# Patient Record
Sex: Female | Born: 1970 | Race: Black or African American | Hispanic: No | Marital: Single | Smoking: Never smoker
Health system: Southern US, Community
[De-identification: ages and names within clinical notes are randomized; demographics above are authoritative.]

## PROBLEM LIST (undated history)

## (undated) DIAGNOSIS — K3184 Gastroparesis: Secondary | ICD-10-CM

## (undated) DIAGNOSIS — I10 Essential (primary) hypertension: Secondary | ICD-10-CM

## (undated) DIAGNOSIS — E119 Type 2 diabetes mellitus without complications: Secondary | ICD-10-CM

## (undated) DIAGNOSIS — F32A Depression, unspecified: Secondary | ICD-10-CM

## (undated) DIAGNOSIS — G629 Polyneuropathy, unspecified: Secondary | ICD-10-CM

## (undated) DIAGNOSIS — F329 Major depressive disorder, single episode, unspecified: Secondary | ICD-10-CM

## (undated) DIAGNOSIS — F419 Anxiety disorder, unspecified: Secondary | ICD-10-CM

## (undated) HISTORY — PX: TUBAL LIGATION: SHX77

## (undated) HISTORY — PX: ABDOMINAL HYSTERECTOMY: SHX81

## (undated) HISTORY — PX: CHOLECYSTECTOMY: SHX55

---

## 1998-08-26 ENCOUNTER — Encounter: Admission: RE | Admit: 1998-08-26 | Discharge: 1998-11-24 | Payer: Self-pay

## 2006-01-24 ENCOUNTER — Ambulatory Visit: Payer: Self-pay | Admitting: Cardiovascular Disease

## 2006-02-03 ENCOUNTER — Encounter: Payer: Self-pay | Admitting: Cardiology

## 2006-02-03 ENCOUNTER — Ambulatory Visit: Payer: Self-pay

## 2007-12-21 ENCOUNTER — Encounter: Payer: Self-pay | Admitting: Internal Medicine

## 2008-01-07 DIAGNOSIS — I10 Essential (primary) hypertension: Secondary | ICD-10-CM

## 2008-01-07 DIAGNOSIS — F329 Major depressive disorder, single episode, unspecified: Secondary | ICD-10-CM

## 2008-01-07 DIAGNOSIS — K219 Gastro-esophageal reflux disease without esophagitis: Secondary | ICD-10-CM

## 2008-01-08 ENCOUNTER — Ambulatory Visit: Payer: Self-pay | Admitting: Internal Medicine

## 2008-01-08 DIAGNOSIS — R05 Cough: Secondary | ICD-10-CM

## 2008-04-14 ENCOUNTER — Ambulatory Visit: Payer: Self-pay | Admitting: Internal Medicine

## 2008-04-15 ENCOUNTER — Ambulatory Visit: Payer: Self-pay | Admitting: Internal Medicine

## 2008-04-23 ENCOUNTER — Ambulatory Visit (HOSPITAL_COMMUNITY): Admission: RE | Admit: 2008-04-23 | Discharge: 2008-04-23 | Payer: Self-pay | Admitting: Internal Medicine

## 2008-04-23 ENCOUNTER — Encounter: Payer: Self-pay | Admitting: Internal Medicine

## 2008-05-15 ENCOUNTER — Ambulatory Visit: Payer: Self-pay | Admitting: Internal Medicine

## 2008-05-27 ENCOUNTER — Emergency Department (HOSPITAL_COMMUNITY): Admission: EM | Admit: 2008-05-27 | Discharge: 2008-05-27 | Payer: Self-pay | Admitting: Emergency Medicine

## 2010-10-22 NOTE — Assessment & Plan Note (Signed)
Deanna Stokes                              CARDIOLOGY OFFICE NOTE   Deanna Stokes, Deanna Stokes                       MRN:          [id number]  DATE:01/24/2006                            DOB:          1970/10/15    IDENTIFYING INFORMATION:  The patient is a 40 year old married woman with 2  children who lives in [place], [place].   CHIEF COMPLAINT:  Chest pain.   HISTORY OF PRESENT ILLNESS:  Deanna Stokes is a very pleasant 40 year old  African-American woman who presents with a longstanding history of  substernal chest pain.  She reports a chest pain syndrome since her early  40s.  It has become slowly progressive over the years and is more frequent  now than it ever has been.  She describes her pain as a heaviness and  pressure.  It is mainly in the mid chest and sometimes radiates to the  back.  The episodes last anywhere from 1-3 hours and occur frequently.  They  are unrelated to food intake, position or activity.  She is unable to  relieve her symptoms when the episodes do occur.  She reports associated  dyspnea, as well as dizziness.  She denies syncope, nausea, vomiting or  diaphoresis.  She has taken 1 sublingual nitroglycerin for her chest  discomfort, which did partially relieve her pain.   She has a longstanding history of severe gastroesophageal reflux disease and  has had multiple esophageal dilatation procedures.  She is able to  distinguish her current chest pain from her reflux pain and says they are  dissimilar.   PAST MEDICAL HISTORY:  Pertinent for the following:  1. Hypertension, treated with Micardis for many years.  2. Gastroesophageal reflux, status post multiple esophageal procedures as      above.  3. Hysterectomy, 2007.  4. Cholecystectomy, 2004.  5. Anxiety and depression.  6. Arthritis.   SOCIAL HISTORY:  The patient is married with 2 children.  She lives in [place]  and she works as a Audiological scientist at [employer].  She  does not smoke cigarettes  or drink alcohol.  She does a lot of walking at work but is not engaged in a  formal exercise program.   FAMILY HISTORY:  Mostly unknown as she was adopted.  She has been told that  her mother died at age 3 of a gunshot and that her father died of a stroke  at an unknown age.  She does know her biological siblings.  She has 1  brother and 2 sisters.  One sister has heart disease but the specifics are  unclear.   CURRENT MEDICATIONS:  1. Micardis 80 mg daily.  2. Nexium 40 mg daily.  3. Xanax 0.5 mg three times daily.  4. Nitroglycerin as needed but has only taken this once.  5. Percocet as needed.  6. Flexeril as needed.   ALLERGIES:  MOTRIN, IBUPROFEN and VICODIN.   REVIEW OF SYSTEMS:  Complete 12-point review of systems was performed.  Pertinent positives included ankle edema, rapid heart rate, headaches,  lightheadedness, shortness of breath, dizziness,  calf pain and back pain.  All other systems were reviewed and are negative except as described above.   PHYSICAL EXAMINATION:  GENERAL:  She is an alert and oriented African-  American woman in no acute distress.  HEIGHT:  5'7  WEIGHT:  206 pounds.  VITAL SIGNS:  Blood pressure 124/92, heart rate 92, respiratory rate 12.  EYES:  Sclerae anicteric.  Conjunctivae pink.  Pupils are equal, round and  reactive to light.  ENT:  Oropharynx is clear.  Moist oral mucosa.  No cervical lymphadenopathy.  CARDIOVASCULAR:  PMI is discrete and nondisplaced.  Heart has regular rate  and rhythm without murmurs or gallops.  Jugular venous pressure is normal.  Carotid upstrokes are normal without bruits.  ABDOMEN:  Soft, nontender.  No organomegaly.  There is a midline abdominal  scar present.  There are no abdominal bruits.  EXTREMITIES:  No clubbing, cyanosis or edema.  Peripheral pulses are 2+ and  equal throughout in the periphery.  SKIN:  Warm and dry without rash.   ELECTROCARDIOGRAM:  EKG shows normal sinus  rhythm and is within normal  limits.   ASSESSMENT/PLAN:  This is a 40 year old woman presenting with longstanding  chest pain that has slowly accelerated over the years.  Her problem list is  as follows:  1. Chest pain.  2. Hypertension.  3. Gastroesophageal reflux disease.   DISCUSSION:  I think it is unlikely that her pain is related to ischemic  cardiac disease due to the lack of association with exercise or exertion.  However, she does have longstanding symptoms.  Her family history is unknown  and she has risk factor of hypertension.  I have suggested and exercise  echocardiogram to evaluate her functional capacity, as well as for  myocardial ischemia.  If this is negative, I would continue therapy with  Micardis for her  hypertension and I do not think any further workup would be necessary.  We  will followup with her by telephone after the results are available.                                 Micheline Chapman, MD    MDC/MedQ  DD:  01/24/2006  DT:  01/25/2006  Job #:  [id number]   cc:   Samuel Jester

## 2012-10-11 ENCOUNTER — Ambulatory Visit: Payer: Self-pay | Admitting: Cardiology

## 2012-11-20 ENCOUNTER — Encounter: Payer: Self-pay | Admitting: Cardiology

## 2014-10-19 ENCOUNTER — Emergency Department (HOSPITAL_COMMUNITY): Payer: Medicare HMO

## 2014-10-19 ENCOUNTER — Emergency Department (HOSPITAL_COMMUNITY)
Admission: EM | Admit: 2014-10-19 | Discharge: 2014-10-19 | Disposition: A | Discharge status: Home / Self Care | Payer: Medicare HMO | Attending: Emergency Medicine | Admitting: Emergency Medicine

## 2014-10-19 ENCOUNTER — Encounter (HOSPITAL_COMMUNITY): Payer: Self-pay | Admitting: Emergency Medicine

## 2014-10-19 ENCOUNTER — Other Ambulatory Visit (HOSPITAL_COMMUNITY): Payer: Self-pay

## 2014-10-19 DIAGNOSIS — R000 Tachycardia, unspecified: Secondary | ICD-10-CM | POA: Diagnosis not present

## 2014-10-19 DIAGNOSIS — R079 Chest pain, unspecified: Secondary | ICD-10-CM | POA: Insufficient documentation

## 2014-10-19 DIAGNOSIS — R011 Cardiac murmur, unspecified: Secondary | ICD-10-CM | POA: Insufficient documentation

## 2014-10-19 DIAGNOSIS — R61 Generalized hyperhidrosis: Secondary | ICD-10-CM | POA: Diagnosis not present

## 2014-10-19 DIAGNOSIS — I10 Essential (primary) hypertension: Secondary | ICD-10-CM | POA: Insufficient documentation

## 2014-10-19 DIAGNOSIS — Z8659 Personal history of other mental and behavioral disorders: Secondary | ICD-10-CM | POA: Diagnosis not present

## 2014-10-19 DIAGNOSIS — R002 Palpitations: Secondary | ICD-10-CM | POA: Diagnosis not present

## 2014-10-19 DIAGNOSIS — Z8669 Personal history of other diseases of the nervous system and sense organs: Secondary | ICD-10-CM | POA: Insufficient documentation

## 2014-10-19 DIAGNOSIS — R42 Dizziness and giddiness: Secondary | ICD-10-CM | POA: Insufficient documentation

## 2014-10-19 DIAGNOSIS — R0602 Shortness of breath: Secondary | ICD-10-CM | POA: Insufficient documentation

## 2014-10-19 DIAGNOSIS — E119 Type 2 diabetes mellitus without complications: Secondary | ICD-10-CM | POA: Diagnosis not present

## 2014-10-19 HISTORY — DX: Depression, unspecified: F32.A

## 2014-10-19 HISTORY — DX: Major depressive disorder, single episode, unspecified: F32.9

## 2014-10-19 HISTORY — DX: Essential (primary) hypertension: I10

## 2014-10-19 HISTORY — DX: Polyneuropathy, unspecified: G62.9

## 2014-10-19 HISTORY — DX: Anxiety disorder, unspecified: F41.9

## 2014-10-19 HISTORY — DX: Type 2 diabetes mellitus without complications: E11.9

## 2014-10-19 LAB — I-STAT TROPONIN, ED
TROPONIN I, POC: 0 ng/mL (ref 0.00–0.08)
Troponin i, poc: 0 ng/mL (ref 0.00–0.08)

## 2014-10-19 LAB — BASIC METABOLIC PANEL
Anion gap: 13 (ref 5–15)
CALCIUM: 9.3 mg/dL (ref 8.9–10.3)
CHLORIDE: 100 mmol/L — AB (ref 101–111)
CO2: 20 mmol/L — AB (ref 22–32)
CREATININE: 0.61 mg/dL (ref 0.44–1.00)
GFR calc non Af Amer: >60 mL/min (ref >60)
Glucose, Bld: 239 mg/dL — ABNORMAL HIGH (ref 65–99)
Potassium: 2.9 mmol/L — ABNORMAL LOW (ref 3.5–5.1)
Sodium: 133 mmol/L — ABNORMAL LOW (ref 135–145)

## 2014-10-19 LAB — CBC
HEMATOCRIT: 35 % — AB (ref 36.0–46.0)
HEMOGLOBIN: 12.6 g/dL (ref 12.0–15.0)
MCH: 29.6 pg (ref 26.0–34.0)
MCHC: 36 g/dL (ref 30.0–36.0)
MCV: 82.4 fL (ref 78.0–100.0)
PLATELETS: 395 10*3/uL (ref 150–400)
RBC: 4.25 MIL/uL (ref 3.87–5.11)
RDW: 11.9 % (ref 11.5–15.5)
WBC: 8.3 10*3/uL (ref 4.0–10.5)

## 2014-10-19 LAB — D-DIMER, QUANTITATIVE (NOT AT ARMC)

## 2014-10-19 LAB — PROTIME-INR
INR: 1.11 (ref 0.00–1.49)
PROTHROMBIN TIME: 14.5 s (ref 11.6–15.2)

## 2014-10-19 MED ORDER — POTASSIUM CHLORIDE CRYS ER 20 MEQ PO TBCR
40.0000 meq | EXTENDED_RELEASE_TABLET | Freq: Once | ORAL | Status: AC
  Administered 2014-10-19: 40 meq via ORAL
  Filled 2014-10-19: qty 2

## 2014-10-19 NOTE — ED Provider Notes (Signed)
CSN: [id number]     Arrival date & time 10/19/14  1505 History   None    Chief Complaint  Patient presents with  . Chest Pain  . Shortness of Breath  . Tachycardia   Deanna KindsJosie G Stokes is a 44 y.o. female with a past medical history significant for diabetes, hypertension and esophageal injury from a remote lye ingestion who presents with chest pain. The patient reports that for the last 6 months, she has had exertional chest pain. The patient describes the pain is located in her central and left chest. The patient denies radiation but says that she does have associated palpitations, tachycardia, diaphoresis, lightheadedness, and shortness of breath. The patient reports that she has not been evaluated in the emergency Department for these complaints however, she did see a PCP last week who referred her to see cardiology next week. The patient was told to stay on bed rest given the exertional component of her symptoms and was given a prescription for nitroglycerin. The patient reports that she took her nitroglycerin 2 days ago which improved her chest pain when she felt.  The patient reports this morning, she went against her bed rest and went to see her father speak at church and while he was having a sermon, she felt sudden onset of her chest pain. The patient came diaphoretic, presyncopal, and short of breath. The EMS team was called and the patient was given aspirin as well as 2 doses of nitroglycerin which completely resolved her symptoms. The patient is chest pain-free on arrival. The patient denies any fevers, chills, nausea, vomiting, dysphagia, diarrhea, dysuria or any rashes. Patient denies any other complaints at this time.   (Consider location/radiation/quality/duration/timing/severity/associated sxs/prior Treatment) Patient is a 44 y.o. female presenting with chest pain and shortness of breath. The history is provided by the patient and the EMS personnel. No language interpreter was used.   Chest Pain Pain location:  Substernal area and L chest Pain quality: crushing and pressure   Pain radiates to:  Does not radiate Pain radiates to the back: no   Pain severity:  Severe Onset quality:  Sudden Duration:  2 hours Timing:  Constant Progression:  Waxing and waning Chronicity:  Recurrent Context: at rest   Context: no trauma   Relieved by:  Rest and nitroglycerin Worsened by:  Exertion Ineffective treatments:  None tried Associated symptoms: diaphoresis, dizziness, palpitations and shortness of breath   Associated symptoms: no abdominal pain, no back pain, no cough, no fatigue, no fever, no headache, no nausea, no near-syncope, no numbness, no syncope, not vomiting and no weakness   Risk factors: diabetes mellitus and hypertension   Risk factors: not female, no prior DVT/PE and no smoking   Shortness of Breath Severity:  Moderate Onset quality:  Gradual Timing:  Intermittent Progression:  Waxing and waning Chronicity:  Recurrent Relieved by:  Nothing Worsened by:  Nothing tried Ineffective treatments:  None tried Associated symptoms: chest pain and diaphoresis   Associated symptoms: no abdominal pain, no cough, no fever, no headaches, no neck pain, no rash, no syncope, no vomiting and no wheezing     Past Medical History  Diagnosis Date  . Hypertension   . Diabetes mellitus without complication   . Neuropathy   . Anxiety   . Depression    Past Surgical History  Procedure Laterality Date  . Cholecystectomy    . Tubal ligation    . Abdominal hysterectomy     No family history on file.  History  Substance Use Topics  . Smoking status: Never Smoker   . Smokeless tobacco: Not on file  . Alcohol Use: Yes   OB History    No data available     Review of Systems  Constitutional: Positive for diaphoresis. Negative for fever, chills, appetite change and fatigue.  HENT: Negative for congestion and rhinorrhea.   Respiratory: Positive for shortness of breath.  Negative for cough, chest tightness, wheezing and stridor.   Cardiovascular: Positive for chest pain and palpitations. Negative for leg swelling, syncope and near-syncope.  Gastrointestinal: Negative for nausea, vomiting, abdominal pain, diarrhea and constipation.  Genitourinary: Negative for dysuria.  Musculoskeletal: Negative for back pain, neck pain and neck stiffness.  Skin: Negative for rash and wound.  Neurological: Positive for dizziness and light-headedness. Negative for syncope, weakness, numbness and headaches.  Psychiatric/Behavioral: Negative for confusion.  All other systems reviewed and are negative.     Allergies  Hydrocodone-acetaminophen and Ibuprofen  Home Medications   Prior to Admission medications   Not on File   BP 117/86 mmHg  Temp(Src) 98.8 F (37.1 C) (Oral)  Resp 22  Ht  (1.702 m)  Wt 196 lb (88.905 kg)  BMI 30.69 kg/m2  SpO2 100% Physical Exam  Constitutional: She is oriented to person, place, and time. She appears well-developed and well-nourished. No distress.  HENT:  Head: Normocephalic and atraumatic.  Mouth/Throat: No oropharyngeal exudate.  Eyes: Conjunctivae and EOM are normal. Pupils are equal, round, and reactive to light.  Neck: Normal range of motion.  Cardiovascular: Regular rhythm and intact distal pulses.   Murmur heard.  Systolic murmur is present with a grade of 2/6  Pulmonary/Chest: Effort normal and breath sounds normal. No accessory muscle usage or stridor. No respiratory distress. She has no wheezes. She has no rales. She exhibits no tenderness and no bony tenderness.  Abdominal: Soft. Bowel sounds are normal. She exhibits no distension. There is no tenderness. There is no rebound and no guarding.  Musculoskeletal: She exhibits no edema.  Neurological: She is alert and oriented to person, place, and time. She displays normal reflexes. She exhibits normal muscle tone.  Skin: Skin is warm. She is not diaphoretic. No pallor.   Psychiatric: She has a normal mood and affect.  Nursing note and vitals reviewed.   ED Course  Procedures (including critical care time) Labs Review Labs Reviewed  CBC - Abnormal; Notable for the following:    HCT 35.0 (*)    All other components within normal limits  BASIC METABOLIC PANEL - Abnormal; Notable for the following:    Sodium 133 (*)    Potassium 2.9 (*)    Chloride 100 (*)    CO2 20 (*)    Glucose, Bld 239 (*)    BUN <5 (*)    All other components within normal limits  PROTIME-INR  D-DIMER, QUANTITATIVE  I-STAT TROPOININ, ED  Rosezena Sensor, ED    Imaging Review Dg Chest 2 View  10/19/2014   CLINICAL DATA:  Chest pain.  Shortness of breath.  Tachycardia.  EXAM: CHEST  2 VIEW  COMPARISON:  08/27/2014 and 11/14/2011  FINDINGS: The heart size and mediastinal contours are within normal limits. Both lungs are clear. The visualized skeletal structures are unremarkable.  IMPRESSION: Normal exam.   Electronically Signed   By: Francene Boyers M.D.   On: 10/19/2014 16:32     EKG Interpretation None        MDM   Final diagnoses:  Chest  pain, unspecified chest pain type   Deanna KindsJosie G Stokes is a 44 y.o. female with a past medical history significant for diabetes, hypertension and esophageal injury from a remote lye ingestion who presents with chest pain. The patient's initial story was concerning for ACS or other cardiac etiology of her symptoms. Also in the differential diagnosis list is pulmonary embolism or esophageal pains given her repaired esophagus from the remote injuries.  The patient's EKG on arrival did not show evidence of an ST elevation MI however, there did appear to be some diffuse ST abnormalities. The patient remained chest pain-free while in the emergency department. The patient's delta troponin was negative 2. The patient had a unremarkable CBC, and her be nothing by mouth was significant for hypokalemia of 2.9. The patient was given by mouth potassium  for replacement. The patient's d-dimer was negative. The patient's chest x-ray showed no acute cardiopulmonary abnormalities.  As the patient's chest pain has resolved and did not return during her several hour observation time in the emergency department, the patient was felt appropriate for discharge. The patient is already scheduled to see her cardiologist in 2 days' time for further management of her chest pain symptoms. PE felt unlikely given the negative d-dimer, ACS felt unlikely given the delta troponin negative 2. Likely source is esophageal, anxiety, or musculoskeletal in nature.  The patient was given extremely strict return precautions for any new or return of her chest pain symptoms. The patient voiced understanding of the return precautions as did her family that was at the bedside. The patient will see her cardiologist in 2 days and will likely need further workup at that time.  The patient had no questions, concerns, or complaints and was discharged in good condition.  This patient was seen with Dr. Rhunette CroftNanavati, ED attending.        Theda Belfasthris Tegeler, MD 10/20/14 [id number]  Derwood KaplanAnkit Nanavati, MD 10/28/14 [id number]

## 2014-10-19 NOTE — ED Notes (Signed)
Received pt via EMS from church with c/o chest pain with shortness of breath since 0500 today. Pt was suppose to be on bedrest but went to church. Pt reports that her heart has been racing throughout the day about 4-5 times. Pt took 1 nitro earlier for pain which relieved pain some what. Pt given 2 Nitro by EMS and 324 mg of ASA. Pain 1/10. CBG 559 for EMS, someone at church gave pt a shot of Novolog but pt does not know how many units.

## 2014-10-19 NOTE — Discharge Instructions (Signed)

## 2014-10-19 NOTE — ED Notes (Signed)
Patient transported to X-ray 

## 2014-10-19 NOTE — ED Notes (Signed)
Pt requesting pain medication.  

## 2016-09-07 LAB — HEMOGLOBIN A1C: Hemoglobin A1C: 11

## 2016-09-14 ENCOUNTER — Ambulatory Visit: Payer: Medicare HMO | Attending: Neurology | Admitting: Neurology

## 2016-09-14 DIAGNOSIS — G4733 Obstructive sleep apnea (adult) (pediatric): Secondary | ICD-10-CM

## 2016-09-17 NOTE — Procedures (Signed)
[facility] Jakelin Taussig A. Gerilyn Pilgrim, MD     [email or web address]             NOCTURNAL POLYSOMNOGRAPHY   LOCATION: [facility]   Patient Name: Anael, Rosch Date: 09/14/2016 Gender: Female D.O.B: 1971/03/15 Age (years): 53 Referring Provider: Jorge Mandril NP Height (inches): 67 Interpreting Physician: Beryle Beams MD, ABSM Weight (lbs): 210 RPSGT: Peak, Robert BMI: 33 MRN: [id number] Neck Size: 15.50 CLINICAL INFORMATION Sleep Study Type: NPSG  Indication for sleep study: N/A  Epworth Sleepiness Score: 11  SLEEP STUDY TECHNIQUE As per the AASM Manual for the Scoring of Sleep and Associated Events v2.3 (April 2016) with a hypopnea requiring 4% desaturations.  The channels recorded and monitored were frontal, central and occipital EEG, electrooculogram (EOG), submentalis EMG (chin), nasal and oral airflow, thoracic and abdominal wall motion, anterior tibialis EMG, snore microphone, electrocardiogram, and pulse oximetry.  MEDICATIONS Medications self-administered by patient taken the night of the study : N/A  Current Outpatient Prescriptions:  .  ALPRAZolam (XANAX) 0.5 MG tablet, Take 0.5 mg by mouth 3 (three) times daily. , Disp: , Rfl:  .  amLODipine (NORVASC) 5 MG tablet, Take 5 mg by mouth daily. , Disp: , Rfl:  .  aspirin EC 81 MG tablet, Take 81 mg by mouth daily., Disp: , Rfl:  .  beclomethasone (QVAR) 40 MCG/ACT inhaler, Inhale 2 puffs into the lungs 3 (three) times daily as needed (shortness of breath/wheezing). , Disp: , Rfl:  .  canagliflozin (INVOKANA) 300 MG TABS tablet, Take 300 mg by mouth daily., Disp: , Rfl:  .  carisoprodol (SOMA) 350 MG tablet, Take 350 mg by mouth 4 (four) times daily. , Disp: , Rfl:  .  cloNIDine (CATAPRES) 0.1 MG tablet, Take 0.2 mg by mouth 2 (two) times daily. , Disp: , Rfl:  .  docusate sodium (COLACE) 100 MG capsule, Take 100 mg by mouth daily as needed for mild constipation., Disp: , Rfl:  .  DULoxetine (CYMBALTA)  60 MG capsule, Take 60 mg by mouth 2 (two) times daily. , Disp: , Rfl:  .  esomeprazole (NEXIUM) 40 MG capsule, Take 40 mg by mouth 2 (two) times daily before a meal. , Disp: , Rfl:  .  estradiol (CLIMARA - DOSED IN MG/24 HR) 0.075 mg/24hr patch, Place 0.075 mg onto the skin every 3 (three) days. , Disp: , Rfl:  .  furosemide (LASIX) 20 MG tablet, Take 20 mg by mouth daily. , Disp: , Rfl:  .  gabapentin (NEURONTIN) 600 MG tablet, Take 600 mg by mouth 3 (three) times daily. , Disp: , Rfl:  .  insulin glargine (LANTUS) 100 UNIT/ML injection, Inject 50 Units into the skin at bedtime., Disp: , Rfl:  .  insulin lispro (HUMALOG) 100 UNIT/ML injection, Inject into the skin 3 (three) times daily before meals. Per sliding scale CBG 0-150 0 units, 151-200 2 units, 201-250 4 units, 251-300 6 units, 301-350 8 units, 351-400 10 units, 401-450 12 units, Disp: , Rfl:  .  lisinopril (PRINIVIL,ZESTRIL) 40 MG tablet, Take 40 mg by mouth daily. , Disp: , Rfl:  .  metoCLOPramide (REGLAN) 10 MG tablet, Take 10 mg by mouth 4 (four) times daily as needed for nausea or vomiting. , Disp: , Rfl:  .  morphine (MSIR) 30 MG tablet, Take 30 mg by mouth every 4 (four) hours. scheduled, Disp: , Rfl:  .  nitroGLYCERIN (NITROSTAT) 0.4 MG SL tablet, Place 0.4 mg under the tongue every 5 (five) minutes as needed  for chest pain., Disp: , Rfl:  .  omega-3 acid ethyl esters (LOVAZA) 1 G capsule, Take 1 g by mouth 2 (two) times daily. OMACOR, Disp: , Rfl:  .  ondansetron (ZOFRAN) 4 MG tablet, Take 4 mg by mouth every 4 (four) hours. Take with morphine, Disp: , Rfl:  .  Prenatal Vit-Fe Fumarate-FA (PRENATAL MULTIVITAMIN) TABS tablet, Take 1 tablet by mouth daily., Disp: , Rfl:  .  temazepam (RESTORIL) 30 MG capsule, Take 30 mg by mouth at bedtime. , Disp: , Rfl:  .  Vitamin D, Ergocalciferol, (DRISDOL) 50000 UNITS CAPS capsule, Take 50,000 Units by mouth 2 (two) times a week. Sunday and Thursday, Disp: , Rfl:    SLEEP ARCHITECTURE The  study was initiated at 9:49:46 PM and ended at 4:39:59 AM.  Sleep onset time was 37.8 minutes and the sleep efficiency was 65.6%. The total sleep time was 268.9 minutes.  Stage REM latency was N/A minutes.  The patient spent 16.33% of the night in stage N1 sleep, 83.67% in stage N2 sleep, 0.00% in stage N3 and 0.00% in REM.  Alpha intrusion was absent.  Supine sleep was 80.21%.  RESPIRATORY PARAMETERS The overall apnea/hypopnea index (AHI) was 5.6 per hour. There were 3 total apneas, including 0 obstructive, 3 central and 0 mixed apneas. There were 22 hypopneas and 47 RERAs.  The AHI during Stage REM sleep was N/A per hour.  AHI while supine was 6.4 per hour.  The mean oxygen saturation was 93.08%. The minimum SpO2 during sleep was 88.00%.  Soft snoring was noted during this study.  CARDIAC DATA The 2 lead EKG demonstrated sinus rhythm. The mean heart rate was 96.75 beats per minute. Other EKG findings include: None. LEG MOVEMENT DATA The total PLMS were 0 with a resulting PLMS index of 0.00. Associated arousal with leg movement index was 0.0.  IMPRESSIONS - Mild obstructive sleep apnea that does not require positive pressure treatment. - Absent slow wave sleep and absent REM sleep are noted.  Argie Ramming, MD Diplomate, American Board of Sleep Medicine.

## 2016-10-14 ENCOUNTER — Encounter: Payer: Self-pay | Admitting: "Endocrinology

## 2016-10-14 ENCOUNTER — Ambulatory Visit (INDEPENDENT_AMBULATORY_CARE_PROVIDER_SITE_OTHER): Payer: Medicare HMO | Admitting: "Endocrinology

## 2016-10-14 VITALS — BP 146/91 | HR 102 | Ht 66.25 in | Wt 223.0 lb

## 2016-10-14 DIAGNOSIS — E7800 Pure hypercholesterolemia, unspecified: Secondary | ICD-10-CM | POA: Diagnosis not present

## 2016-10-14 DIAGNOSIS — E1169 Type 2 diabetes mellitus with other specified complication: Secondary | ICD-10-CM | POA: Diagnosis not present

## 2016-10-14 DIAGNOSIS — Z6839 Body mass index (BMI) 39.0-39.9, adult: Secondary | ICD-10-CM

## 2016-10-14 DIAGNOSIS — E1165 Type 2 diabetes mellitus with hyperglycemia: Secondary | ICD-10-CM | POA: Diagnosis not present

## 2016-10-14 DIAGNOSIS — Z794 Long term (current) use of insulin: Secondary | ICD-10-CM | POA: Diagnosis not present

## 2016-10-14 DIAGNOSIS — IMO0002 Reserved for concepts with insufficient information to code with codable children: Secondary | ICD-10-CM | POA: Insufficient documentation

## 2016-10-14 DIAGNOSIS — I10 Essential (primary) hypertension: Secondary | ICD-10-CM | POA: Diagnosis not present

## 2016-10-14 NOTE — Progress Notes (Signed)
Subjective:    Patient ID: Deanna Stokes, female    DOB: July 28, 1970. Patient is being seen in Consultation for management of diabetes requested by  Ernestine Conrad, MD  Past Medical History:  Diagnosis Date  . Anxiety   . Depression   . Diabetes mellitus without complication (HCC)   . Hypertension   . Neuropathy    Past Surgical History:  Procedure Laterality Date  . ABDOMINAL HYSTERECTOMY    . CHOLECYSTECTOMY    . TUBAL LIGATION     Social History   Social History  . Marital status: Single    Spouse name: N/A  . Number of children: N/A  . Years of education: N/A   Social History Main Topics  . Smoking status: Never Smoker  . Smokeless tobacco: Never Used  . Alcohol use Yes  . Drug use: No  . Sexual activity: Not Asked   Other Topics Concern  . None   Social History Narrative  . None   Outpatient Encounter Prescriptions as of 10/14/2016  Medication Sig  . amLODipine (NORVASC) 5 MG tablet Take 5 mg by mouth daily.   Marland Kitchen aspirin EC 81 MG tablet Take 81 mg by mouth daily.  Marland Kitchen atorvastatin (LIPITOR) 20 MG tablet Take 20 mg by mouth daily.  . cloNIDine (CATAPRES) 0.1 MG tablet Take 0.1 mg by mouth 2 (two) times daily.  . DULoxetine (CYMBALTA) 60 MG capsule Take 60 mg by mouth daily.  Marland Kitchen esomeprazole (NEXIUM) 40 MG capsule Take 40 mg by mouth daily at 12 noon.  Marland Kitchen estradiol (CLIMARA - DOSED IN MG/24 HR) 0.075 mg/24hr patch Place 0.075 mg onto the skin once a week.  . furosemide (LASIX) 20 MG tablet Take 20 mg by mouth.  . gabapentin (NEURONTIN) 600 MG tablet Take 600 mg by mouth 3 (three) times daily.  . hydrOXYzine (ATARAX/VISTARIL) 25 MG tablet Take 25 mg by mouth 3 (three) times daily as needed.  . insulin aspart protamine - aspart (NOVOLOG MIX 70/30 FLEXPEN) (70-30) 100 UNIT/ML FlexPen Inject 20-26 Units into the skin 3 (three) times daily with meals.  . Insulin Glargine (LANTUS SOLOSTAR) 100 UNIT/ML Solostar Pen Inject 60 Units into the skin at bedtime.  Marland Kitchen lisinopril  (PRINIVIL,ZESTRIL) 40 MG tablet Take 40 mg by mouth daily.  . metoCLOPramide (REGLAN) 10 MG tablet Take 10 mg by mouth 4 (four) times daily as needed for nausea or vomiting.   Marland Kitchen omega-3 acid ethyl esters (LOVAZA) 1 G capsule Take 1 g by mouth 2 (two) times daily. OMACOR  . ondansetron (ZOFRAN) 4 MG tablet Take 4 mg by mouth every 8 (eight) hours as needed for nausea or vomiting.  . temazepam (RESTORIL) 30 MG capsule Take 30 mg by mouth at bedtime as needed for sleep.  Marland Kitchen tiZANidine (ZANAFLEX) 4 MG tablet Take 8 mg by mouth every 8 (eight) hours as needed for muscle spasms.  . Vitamin D, Ergocalciferol, (DRISDOL) 50000 UNITS CAPS capsule Take 50,000 Units by mouth 2 (two) times a week. Sunday and Thursday  . [DISCONTINUED] ALPRAZolam (XANAX) 0.5 MG tablet Take 0.5 mg by mouth 3 (three) times daily.   . [DISCONTINUED] aspirin EC 81 MG tablet Take 81 mg by mouth daily.  . [DISCONTINUED] beclomethasone (QVAR) 40 MCG/ACT inhaler Inhale 2 puffs into the lungs 3 (three) times daily as needed (shortness of breath/wheezing).   . [DISCONTINUED] canagliflozin (INVOKANA) 300 MG TABS tablet Take 300 mg by mouth daily.  . [DISCONTINUED] carisoprodol (SOMA) 350 MG tablet Take  350 mg by mouth 4 (four) times daily.   . [DISCONTINUED] cloNIDine (CATAPRES) 0.1 MG tablet Take 0.2 mg by mouth 2 (two) times daily.   . [DISCONTINUED] docusate sodium (COLACE) 100 MG capsule Take 100 mg by mouth daily as needed for mild constipation.  . [DISCONTINUED] DULoxetine (CYMBALTA) 60 MG capsule Take 60 mg by mouth 2 (two) times daily.   . [DISCONTINUED] esomeprazole (NEXIUM) 40 MG capsule Take 40 mg by mouth 2 (two) times daily before a meal.   . [DISCONTINUED] estradiol (CLIMARA - DOSED IN MG/24 HR) 0.075 mg/24hr patch Place 0.075 mg onto the skin every 3 (three) days.   . [DISCONTINUED] furosemide (LASIX) 20 MG tablet Take 20 mg by mouth daily.   . [DISCONTINUED] gabapentin (NEURONTIN) 600 MG tablet Take 600 mg by mouth 3 (three)  times daily.   . [DISCONTINUED] insulin glargine (LANTUS) 100 UNIT/ML injection Inject 50 Units into the skin at bedtime.  . [DISCONTINUED] insulin lispro (HUMALOG) 100 UNIT/ML injection Inject into the skin 3 (three) times daily before meals. Per sliding scale CBG 0-150 0 units, 151-200 2 units, 201-250 4 units, 251-300 6 units, 301-350 8 units, 351-400 10 units, 401-450 12 units  . [DISCONTINUED] lisinopril (PRINIVIL,ZESTRIL) 40 MG tablet Take 40 mg by mouth daily.   . [DISCONTINUED] morphine (MSIR) 30 MG tablet Take 30 mg by mouth every 4 (four) hours. scheduled  . [DISCONTINUED] nitroGLYCERIN (NITROSTAT) 0.4 MG SL tablet Place 0.4 mg under the tongue every 5 (five) minutes as needed for chest pain.  . [DISCONTINUED] ondansetron (ZOFRAN) 4 MG tablet Take 4 mg by mouth every 4 (four) hours. Take with morphine  . [DISCONTINUED] Prenatal Vit-Fe Fumarate-FA (PRENATAL MULTIVITAMIN) TABS tablet Take 1 tablet by mouth daily.  . [DISCONTINUED] temazepam (RESTORIL) 30 MG capsule Take 30 mg by mouth at bedtime.    No facility-administered encounter medications on file as of 10/14/2016.    ALLERGIES: Allergies  Allergen Reactions  . Ibuprofen Shortness Of Breath and Rash  . Ciprofloxacin Hives  . Metformin And Related Other (See Comments)    MD told patient not to take  . Hydrocodone-Acetaminophen Rash   VACCINATION STATUS:  There is no immunization history on file for this patient.  Diabetes  She presents for her initial diabetic visit. She has type 1 diabetes mellitus. Onset time: She was diagnosed at approximate age of 46 years. Her disease course has been worsening. There are no hypoglycemic associated symptoms. Pertinent negatives for hypoglycemia include no confusion, headaches, pallor or seizures. Associated symptoms include blurred vision, fatigue, foot paresthesias, polydipsia and polyuria. Pertinent negatives for diabetes include no chest pain and no polyphagia. There are no hypoglycemic  complications. Symptoms are worsening. Diabetic complications include peripheral neuropathy and retinopathy. Risk factors for coronary artery disease include diabetes mellitus, dyslipidemia, family history, hypertension, obesity and sedentary lifestyle. Current diabetic treatment includes intensive insulin program. She is following a generally unhealthy diet. When asked about meal planning, she reported none. She has not had a previous visit with a dietitian. She never participates in exercise. Her home blood glucose trend is fluctuating dramatically. Her overall blood glucose range is >200 mg/dl. (She brought her log showing significantly frustrating blood glucose profile, her most recent A1c was 11% on 09/07/2016.) An ACE inhibitor/angiotensin II receptor blocker is being taken. Eye exam is current.  Hyperlipidemia  This is a chronic problem. The current episode started more than 1 year ago. Exacerbating diseases include diabetes and obesity. Pertinent negatives include no chest pain, myalgias  or shortness of breath. Risk factors for coronary artery disease include diabetes mellitus, dyslipidemia, hypertension, obesity and a sedentary lifestyle.  Hypertension  This is a chronic problem. The current episode started more than 1 year ago. Associated symptoms include blurred vision. Pertinent negatives include no chest pain, headaches, palpitations or shortness of breath. Risk factors for coronary artery disease include dyslipidemia, diabetes mellitus, obesity and sedentary lifestyle. Past treatments include ACE inhibitors. Hypertensive end-organ damage includes retinopathy.       Review of Systems  Constitutional: Positive for fatigue. Negative for chills, fever and unexpected weight change.  HENT: Negative for trouble swallowing and voice change.   Eyes: Positive for blurred vision. Negative for visual disturbance.  Respiratory: Negative for cough, shortness of breath and wheezing.   Cardiovascular:  Negative for chest pain, palpitations and leg swelling.  Gastrointestinal: Negative for diarrhea, nausea and vomiting.  Endocrine: Positive for polydipsia and polyuria. Negative for cold intolerance, heat intolerance and polyphagia.  Musculoskeletal: Negative for arthralgias and myalgias.  Skin: Negative for color change, pallor, rash and wound.  Neurological: Negative for seizures and headaches.  Psychiatric/Behavioral: Negative for confusion and suicidal ideas.    Objective:    BP (!) 146/91   Pulse (!) 102   Ht 5' 6.25" (1.683 m)   Wt 223 lb (101.2 kg)   BMI 35.72 kg/m   Wt Readings from Last 3 Encounters:  10/14/16 223 lb (101.2 kg)  09/15/16 210 lb (95.3 kg)  10/19/14 196 lb (88.9 kg)    Physical Exam  Constitutional: She is oriented to person, place, and time. She appears well-developed.  Walks with a cane due to peripheral neuropathy.  HENT:  Head: Normocephalic and atraumatic.  Eyes: EOM are normal.  Neck: Normal range of motion. Neck supple. No tracheal deviation present. No thyromegaly present.  Cardiovascular: Normal rate and regular rhythm.   Pulmonary/Chest: Effort normal and breath sounds normal.  Abdominal: Soft. Bowel sounds are normal. There is no tenderness. There is no guarding.  Musculoskeletal: Normal range of motion. She exhibits no edema.  Neurological: She is alert and oriented to person, place, and time. She has normal reflexes. No cranial nerve deficit. Coordination normal.  Skin: Skin is warm and dry. No rash noted. No erythema. No pallor.  Psychiatric: She has a normal mood and affect. Judgment normal.     CMP ( most recent) CMP     Component Value Date/Time   NA 133 (L) 10/19/2014 1542   K 2.9 (L) 10/19/2014 1542   CL 100 (L) 10/19/2014 1542   CO2 20 (L) 10/19/2014 1542   GLUCOSE 239 (H) 10/19/2014 1542   BUN <5 (L) 10/19/2014 1542   CREATININE 0.61 10/19/2014 1542   CALCIUM 9.3 10/19/2014 1542   GFRNONAA >60 10/19/2014 1542   GFRAA >60  10/19/2014 1542   A1c from 04/04/ 2018 was 11%   Assessment & Plan:   1. Uncontrolled type 2 diabetes mellitus with other specified complication, with long-term current use of insulin (HCC)  - Patient has currently uncontrolled symptomatic type 1 DM since  46 years of age,  with most recent A1c of 11 %. Recent labs reviewed.   Her diabetes is complicated by peripheral neuropathy, bilateral retinopathy, obesity/sedentary life and patient remains at a high risk for more acute and chronic complications of diabetes which include CAD, CVA, CKD, retinopathy, and neuropathy. These are all discussed in detail with the patient.  - I have counseled the patient on diet management and weight loss, by  adopting a carbohydrate restricted/protein rich diet.  - Suggestion is made for patient to avoid simple carbohydrates   from their diet including Cakes , Desserts, Ice Cream,  Soda (  diet and regular) , Sweet Tea , Candies,  Chips, Cookies, Artificial Sweeteners,   and "Sugar-free" Products . This will help patient to have stable blood glucose profile and potentially avoid unintended weight gain.  - I encouraged the patient to switch to  unprocessed or minimally processed complex starch and increased protein intake (animal or plant source), fruits, and vegetables.  - Patient is advised to stick to a routine mealtimes to eat 3 meals  a day and avoid unnecessary snacks ( to snack only to correct hypoglycemia).  - The patient will be scheduled with Norm Salt, RDN, CDE for individualized DM education.  - I have approached patient with the following individualized plan to manage diabetes and patient agrees:   - She reports multiple episodes of diabetic ketoacidosis in the past, at least once in the last year. - Given her current significant glycemic burden, she will continue to need intensive treatment with basal/bolus insulin. -  I  will proceed to adjust her basal insulin Levemir to 60 units daily at  bedtime, lower her prandial insulin NovoLog to 20 units 3 times a day before meals for pre-meal BG readings of 90-150mg /dl, plus patient specific correction dose for unexpected hyperglycemia above 150mg /dl, associated with strict monitoring of glucose  AC and HS. - Patient is warned not to take insulin without proper monitoring per orders. -Adjustment parameters are given for hypo and hyperglycemia in writing. -Patient is encouraged to call clinic for blood glucose levels less than 70 or above 300 mg /dl. - She may need studies to classify her diabetes properly when she approaches target A1c of 7-8%.  - If she has type 2 diabetes rather than type I, she may benefit from either none insulin therapeutic options. - Patient specific target  A1c;  LDL, HDL, Triglycerides, and  Waist Circumference were discussed in detail.  2) BP/HTN: Uncontrolled. Continue current medications including ACEI/ARB. 3) Lipids/HPL:   Control unknown.   Patient is advised tocontinue statins. 4)  Weight/Diet: CDE Consult will be initiated , exercise, and detailed carbohydrates information provided.  5) Chronic Care/Health Maintenance:  -Patient is on ACEI/ARB and Statin medications and encouraged to continue to follow up with Ophthalmology, Podiatrist at least yearly or according to recommendations, and advised to   stay away from smoking. I have recommended yearly flu vaccine and pneumonia vaccination at least every 5 years; moderate intensity exercise for up to 150 minutes weekly; and  sleep for at least 7 hours a day.  - 60 minutes of time was spent on the care of this patient , 50% of which was applied for counseling on diabetes complications and their preventions.  - Patient to bring meter and  blood glucose logs during her next visit.   - I advised patient to maintain close follow up with Ernestine Conrad, MD for primary care needs.  Follow up plan: - Return in about 1 week (around 10/21/2016) for follow up with meter  and logs- no labs.  Marquis Lunch, MD Phone: [phone]  Fax: [phone]   10/14/2016, 12:23 PM

## 2016-10-14 NOTE — Patient Instructions (Signed)

## 2016-10-26 ENCOUNTER — Ambulatory Visit (INDEPENDENT_AMBULATORY_CARE_PROVIDER_SITE_OTHER): Payer: Medicare HMO | Admitting: "Endocrinology

## 2016-10-26 ENCOUNTER — Encounter: Payer: Self-pay | Admitting: "Endocrinology

## 2016-10-26 ENCOUNTER — Other Ambulatory Visit: Payer: Self-pay

## 2016-10-26 VITALS — BP 116/77 | HR 89 | Ht 66.25 in | Wt 229.0 lb

## 2016-10-26 DIAGNOSIS — Z6839 Body mass index (BMI) 39.0-39.9, adult: Secondary | ICD-10-CM

## 2016-10-26 DIAGNOSIS — IMO0002 Reserved for concepts with insufficient information to code with codable children: Secondary | ICD-10-CM

## 2016-10-26 DIAGNOSIS — E1065 Type 1 diabetes mellitus with hyperglycemia: Secondary | ICD-10-CM

## 2016-10-26 DIAGNOSIS — I10 Essential (primary) hypertension: Secondary | ICD-10-CM

## 2016-10-26 DIAGNOSIS — E7800 Pure hypercholesterolemia, unspecified: Secondary | ICD-10-CM | POA: Diagnosis not present

## 2016-10-26 DIAGNOSIS — E1042 Type 1 diabetes mellitus with diabetic polyneuropathy: Secondary | ICD-10-CM

## 2016-10-26 MED ORDER — GLUCAGON (RDNA) 1 MG IJ KIT: 1.0000 mg | PACK | Freq: Once | INTRAMUSCULAR | 12 refills | Status: AC | PRN

## 2016-10-26 MED ORDER — GLUCAGON HCL (RDNA) 1 MG IJ SOLR: 1.0000 mg | Freq: Once | INTRAMUSCULAR | 1 refills | Status: DC | PRN

## 2016-10-26 NOTE — Patient Instructions (Signed)

## 2016-10-26 NOTE — Progress Notes (Signed)
Subjective:    Patient ID: Deanna Stokes, female    DOB: 10-30-1970. Patient is being seen in f/u  for management of diabetes .  Past Medical History:  Diagnosis Date  . Anxiety   . Depression   . Diabetes mellitus without complication (HCC)   . Hypertension   . Neuropathy    Past Surgical History:  Procedure Laterality Date  . ABDOMINAL HYSTERECTOMY    . CHOLECYSTECTOMY    . TUBAL LIGATION     Social History   Social History  . Marital status: Single    Spouse name: N/A  . Number of children: N/A  . Years of education: N/A   Social History Main Topics  . Smoking status: Never Smoker  . Smokeless tobacco: Never Used  . Alcohol use Yes  . Drug use: No  . Sexual activity: Not Asked   Other Topics Concern  . None   Social History Narrative  . None   Outpatient Encounter Prescriptions as of 10/26/2016  Medication Sig  . amLODipine (NORVASC) 5 MG tablet Take 5 mg by mouth daily.   Marland Kitchen aspirin EC 81 MG tablet Take 81 mg by mouth daily.  Marland Kitchen atorvastatin (LIPITOR) 20 MG tablet Take 20 mg by mouth daily.  . cloNIDine (CATAPRES) 0.1 MG tablet Take 0.1 mg by mouth 2 (two) times daily.  . DULoxetine (CYMBALTA) 60 MG capsule Take 60 mg by mouth daily.  Marland Kitchen esomeprazole (NEXIUM) 40 MG capsule Take 40 mg by mouth daily at 12 noon.  Marland Kitchen estradiol (CLIMARA - DOSED IN MG/24 HR) 0.075 mg/24hr patch Place 0.075 mg onto the skin once a week.  . furosemide (LASIX) 20 MG tablet Take 20 mg by mouth.  . gabapentin (NEURONTIN) 600 MG tablet Take 600 mg by mouth 3 (three) times daily.  Marland Kitchen glucagon (GLUCAGON EMERGENCY) 1 MG injection Inject 1 mg into the vein once as needed.  . hydrOXYzine (ATARAX/VISTARIL) 25 MG tablet Take 25 mg by mouth 3 (three) times daily as needed.  . insulin aspart protamine - aspart (NOVOLOG MIX 70/30 FLEXPEN) (70-30) 100 UNIT/ML FlexPen Inject 15-21 Units into the skin 3 (three) times daily with meals.  . Insulin Glargine (LANTUS SOLOSTAR) 100 UNIT/ML Solostar Pen  Inject 40 Units into the skin at bedtime.  Marland Kitchen lisinopril (PRINIVIL,ZESTRIL) 40 MG tablet Take 40 mg by mouth daily.  . metoCLOPramide (REGLAN) 10 MG tablet Take 10 mg by mouth 4 (four) times daily as needed for nausea or vomiting.   Marland Kitchen omega-3 acid ethyl esters (LOVAZA) 1 G capsule Take 1 g by mouth 2 (two) times daily. OMACOR  . ondansetron (ZOFRAN) 4 MG tablet Take 4 mg by mouth every 8 (eight) hours as needed for nausea or vomiting.  . temazepam (RESTORIL) 30 MG capsule Take 30 mg by mouth at bedtime as needed for sleep.  Marland Kitchen tiZANidine (ZANAFLEX) 4 MG tablet Take 8 mg by mouth every 8 (eight) hours as needed for muscle spasms.  . Vitamin D, Ergocalciferol, (DRISDOL) 50000 UNITS CAPS capsule Take 50,000 Units by mouth 2 (two) times a week. Sunday and Thursday   No facility-administered encounter medications on file as of 10/26/2016.    ALLERGIES: Allergies  Allergen Reactions  . Ibuprofen Shortness Of Breath and Rash  . Ciprofloxacin Hives  . Metformin And Related Other (See Comments)    MD told patient not to take  . Hydrocodone-Acetaminophen Rash   VACCINATION STATUS:  There is no immunization history on file for this patient.  Diabetes  She presents for her follow-up diabetic visit. She has type 2 diabetes mellitus. Onset time: She was diagnosed at approximate age of 31 years. Her disease course has been worsening. There are no hypoglycemic associated symptoms. Pertinent negatives for hypoglycemia include no confusion, headaches, pallor or seizures. Associated symptoms include blurred vision, fatigue, foot paresthesias, polydipsia and polyuria. Pertinent negatives for diabetes include no chest pain and no polyphagia. There are no hypoglycemic complications. Symptoms are worsening. Diabetic complications include peripheral neuropathy and retinopathy. Risk factors for coronary artery disease include diabetes mellitus, dyslipidemia, family history, hypertension, obesity and sedentary  lifestyle. Current diabetic treatment includes intensive insulin program. Her weight is increasing steadily. She is following a generally unhealthy diet. When asked about meal planning, she reported none. She has not had a previous visit with a dietitian. She never participates in exercise. Her home blood glucose trend is fluctuating dramatically. Her breakfast blood glucose range is generally 90-110 mg/dl. Her lunch blood glucose range is generally 180-200 mg/dl. Her dinner blood glucose range is generally 180-200 mg/dl. Her overall blood glucose range is 180-200 mg/dl. (She brought her log showing significantly frustrating blood glucose profile, her most recent A1c was 11% on 09/07/2016.) An ACE inhibitor/angiotensin II receptor blocker is being taken. Eye exam is current.  Hyperlipidemia  This is a chronic problem. The current episode started more than 1 year ago. Exacerbating diseases include diabetes and obesity. Pertinent negatives include no chest pain, myalgias or shortness of breath. Risk factors for coronary artery disease include diabetes mellitus, dyslipidemia, hypertension, obesity and a sedentary lifestyle.  Hypertension  This is a chronic problem. The current episode started more than 1 year ago. Associated symptoms include blurred vision. Pertinent negatives include no chest pain, headaches, palpitations or shortness of breath. Risk factors for coronary artery disease include dyslipidemia, diabetes mellitus, obesity and sedentary lifestyle. Past treatments include ACE inhibitors. Hypertensive end-organ damage includes retinopathy.     Review of Systems  Constitutional: Positive for fatigue. Negative for chills, fever and unexpected weight change.  HENT: Negative for trouble swallowing and voice change.   Eyes: Positive for blurred vision. Negative for visual disturbance.  Respiratory: Negative for cough, shortness of breath and wheezing.   Cardiovascular: Negative for chest pain,  palpitations and leg swelling.  Gastrointestinal: Negative for diarrhea, nausea and vomiting.  Endocrine: Positive for polydipsia and polyuria. Negative for cold intolerance, heat intolerance and polyphagia.  Musculoskeletal: Negative for arthralgias and myalgias.  Skin: Negative for color change, pallor, rash and wound.  Neurological: Negative for seizures and headaches.  Psychiatric/Behavioral: Negative for confusion and suicidal ideas.    Objective:    BP 116/77   Pulse 89   Ht 5' 6.25" (1.683 m)   Wt 229 lb (103.9 kg)   BMI 36.68 kg/m   Wt Readings from Last 3 Encounters:  10/26/16 229 lb (103.9 kg)  10/14/16 223 lb (101.2 kg)  09/15/16 210 lb (95.3 kg)    Physical Exam  Constitutional: She is oriented to person, place, and time. She appears well-developed.  Walks with a cane due to peripheral neuropathy.  HENT:  Head: Normocephalic and atraumatic.  Eyes: EOM are normal.  Neck: Normal range of motion. Neck supple. No tracheal deviation present. No thyromegaly present.  Cardiovascular: Normal rate and regular rhythm.   Pulmonary/Chest: Effort normal and breath sounds normal.  Abdominal: Soft. Bowel sounds are normal. There is no tenderness. There is no guarding.  Musculoskeletal: Normal range of motion. She exhibits no edema.  Neurological:  She is alert and oriented to person, place, and time. She has normal reflexes. No cranial nerve deficit. Coordination normal.  Skin: Skin is warm and dry. No rash noted. No erythema. No pallor.  Psychiatric: She has a normal mood and affect. Judgment normal.     CMP ( most recent) CMP     Component Value Date/Time   NA 133 (L) 10/19/2014 1542   K 2.9 (L) 10/19/2014 1542   CL 100 (L) 10/19/2014 1542   CO2 20 (L) 10/19/2014 1542   GLUCOSE 239 (H) 10/19/2014 1542   BUN <5 (L) 10/19/2014 1542   CREATININE 0.61 10/19/2014 1542   CALCIUM 9.3 10/19/2014 1542   GFRNONAA >60 10/19/2014 1542   GFRAA >60 10/19/2014 1542   A1c from  04/04/ 2018 was 11%   Assessment & Plan:   1. Uncontrolled type 1 diabetes mellitus with other specified complication, with long-term current use of insulin (HCC)  - Patient has currently uncontrolled symptomatic type 1 DM since  46 years of age,  with most recent A1c of 11 %. Recent labs reviewed.   Her diabetes is complicated by peripheral neuropathy, bilateral retinopathy, obesity/sedentary life and patient remains at a high risk for more acute and chronic complications of diabetes which include CAD, CVA, CKD, retinopathy, and neuropathy. These are all discussed in detail with the patient.  - I have counseled the patient on diet management and weight loss, by adopting a carbohydrate restricted/protein rich diet.  - Suggestion is made for patient to avoid simple carbohydrates   from their diet including Cakes , Desserts, Ice Cream,  Soda (  diet and regular) , Sweet Tea , Candies,  Chips, Cookies, Artificial Sweeteners,   and "Sugar-free" Products . This will help patient to have stable blood glucose profile and potentially avoid unintended weight gain.  - I encouraged the patient to switch to  unprocessed or minimally processed complex starch and increased protein intake (animal or plant source), fruits, and vegetables.  - Patient is advised to stick to a routine mealtimes to eat 3 meals  a day and avoid unnecessary snacks ( to snack only to correct hypoglycemia).  - The patient will be scheduled with Norm SaltPenny Crumpton, RDN, CDE for individualized DM education.  - I have approached patient with the following individualized plan to manage diabetes and patient agrees:   - She reports multiple episodes of diabetic ketoacidosis in the past, at least once in the last year. - Given her current significant glycemic burden, she will continue to need intensive treatment with basal/bolus insulin. -  I  will proceed to Lower her Levemir to 40 units daily at bedtime, lower her prandial insulin NovoLog to  15 units 3 times a day before meals for pre-meal BG readings of 90-150mg /dl, plus patient specific correction dose for unexpected hyperglycemia above 150mg /dl, associated with strict monitoring of glucose  AC and HS. - Patient is warned not to take insulin without proper monitoring per orders. -Adjustment parameters are given for hypo and hyperglycemia in writing. -Patient is encouraged to call clinic for blood glucose levels less than 70 or above 300 mg /dl. - She will need additional studies to classify her diabetes properly.  - If she has type 2 diabetes rather than type I, she may benefit from either none insulin therapeutic options. - Patient specific target  A1c;  LDL, HDL, Triglycerides, and  Waist Circumference were discussed in detail.  2) BP/HTN: controlled. Continue current medications including ACEI/ARB. 3) Lipids/HPL:  Control unknown.   Patient is advised tocontinue statins. 4)  Weight/Diet: CDE Consult has been initiated , exercise, and detailed carbohydrates information provided.  5) Chronic Care/Health Maintenance:  -Patient is on ACEI/ARB and Statin medications and encouraged to continue to follow up with Ophthalmology, Podiatrist at least yearly or according to recommendations, and advised to   stay away from smoking. I have recommended yearly flu vaccine and pneumonia vaccination at least every 5 years; moderate intensity exercise for up to 150 minutes weekly; and  sleep for at least 7 hours a day.  - 30 minutes of time was spent on the care of this patient , 50% of which was applied for counseling on diabetes complications and their preventions.  - Patient to bring meter and  blood glucose logs during her next visit.   - I advised patient to maintain close follow up with Dr. Margo Common for primary care needs.  Follow up plan: - Return in about 9 weeks (around 12/28/2016) for follow up with pre-visit labs, meter, and logs.  Marquis Lunch, MD Phone: [phone]  Fax:  [phone]   10/26/2016, 12:02 PM

## 2016-11-14 ENCOUNTER — Ambulatory Visit: Payer: Self-pay | Admitting: Nutrition

## 2016-12-19 ENCOUNTER — Ambulatory Visit: Payer: Self-pay | Admitting: Nutrition

## 2016-12-28 ENCOUNTER — Ambulatory Visit: Payer: Medicare HMO | Admitting: "Endocrinology

## 2017-01-12 ENCOUNTER — Ambulatory Visit: Payer: Medicare HMO | Admitting: "Endocrinology

## 2017-02-13 ENCOUNTER — Ambulatory Visit: Payer: Medicare HMO | Admitting: "Endocrinology

## 2017-02-16 LAB — LIPID PANEL
CHOLESTEROL: 169 (ref 0–200)
HDL: 38 (ref 35–70)
LDL Cholesterol: 94
TRIGLYCERIDES: 188 — AB (ref 40–160)

## 2017-02-16 LAB — BASIC METABOLIC PANEL
BUN: 5 (ref 4–21)
Creatinine: 0.6 (ref <=1.1)

## 2017-02-16 LAB — TSH: TSH: 4.85 (ref <=5.90)

## 2017-02-16 LAB — HEMOGLOBIN A1C: HEMOGLOBIN A1C: 11.9

## 2017-02-27 ENCOUNTER — Encounter: Payer: Self-pay | Admitting: "Endocrinology

## 2017-02-27 ENCOUNTER — Ambulatory Visit (INDEPENDENT_AMBULATORY_CARE_PROVIDER_SITE_OTHER): Payer: Medicare HMO | Admitting: "Endocrinology

## 2017-02-27 VITALS — BP 140/95 | HR 105 | Ht 66.25 in | Wt 227.0 lb

## 2017-02-27 DIAGNOSIS — I10 Essential (primary) hypertension: Secondary | ICD-10-CM | POA: Diagnosis not present

## 2017-02-27 DIAGNOSIS — E782 Mixed hyperlipidemia: Secondary | ICD-10-CM | POA: Insufficient documentation

## 2017-02-27 DIAGNOSIS — E1065 Type 1 diabetes mellitus with hyperglycemia: Secondary | ICD-10-CM | POA: Diagnosis not present

## 2017-02-27 DIAGNOSIS — IMO0002 Reserved for concepts with insufficient information to code with codable children: Secondary | ICD-10-CM | POA: Insufficient documentation

## 2017-02-27 DIAGNOSIS — E1042 Type 1 diabetes mellitus with diabetic polyneuropathy: Secondary | ICD-10-CM | POA: Diagnosis not present

## 2017-02-27 DIAGNOSIS — Z6839 Body mass index (BMI) 39.0-39.9, adult: Secondary | ICD-10-CM

## 2017-02-27 NOTE — Patient Instructions (Signed)

## 2017-02-27 NOTE — Progress Notes (Signed)
Subjective:    Patient ID: Deanna Stokes, female    DOB: August 12, 1970. Patient is being seen in f/u  for management of diabetes .  Past Medical History:  Diagnosis Date  . Anxiety   . Depression   . Diabetes mellitus without complication (HCC)   . Hypertension   . Neuropathy    Past Surgical History:  Procedure Laterality Date  . ABDOMINAL HYSTERECTOMY    . CHOLECYSTECTOMY    . TUBAL LIGATION     Social History   Social History  . Marital status: Single    Spouse name: N/A  . Number of children: N/A  . Years of education: N/A   Social History Main Topics  . Smoking status: Never Smoker  . Smokeless tobacco: Never Used  . Alcohol use Yes  . Drug use: No  . Sexual activity: Not Asked   Other Topics Concern  . None   Social History Narrative  . None   Outpatient Encounter Prescriptions as of 02/27/2017  Medication Sig  . insulin aspart (NOVOLOG FLEXPEN) 100 UNIT/ML FlexPen Inject 15-21 Units into the skin 3 (three) times daily with meals.  Marland Kitchen amLODipine (NORVASC) 5 MG tablet Take 5 mg by mouth daily.   Marland Kitchen aspirin EC 81 MG tablet Take 81 mg by mouth daily.  Marland Kitchen atorvastatin (LIPITOR) 20 MG tablet Take 20 mg by mouth daily.  . cloNIDine (CATAPRES) 0.1 MG tablet Take 0.1 mg by mouth 2 (two) times daily.  . DULoxetine (CYMBALTA) 60 MG capsule Take 60 mg by mouth daily.  Marland Kitchen esomeprazole (NEXIUM) 40 MG capsule Take 40 mg by mouth daily at 12 noon.  Marland Kitchen estradiol (CLIMARA - DOSED IN MG/24 HR) 0.075 mg/24hr patch Place 0.075 mg onto the skin once a week.  . furosemide (LASIX) 20 MG tablet Take 20 mg by mouth.  . gabapentin (NEURONTIN) 600 MG tablet Take 600 mg by mouth 3 (three) times daily.  Marland Kitchen glucagon (GLUCAGON EMERGENCY) 1 MG injection Inject 1 mg into the vein once as needed.  . hydrOXYzine (ATARAX/VISTARIL) 25 MG tablet Take 25 mg by mouth 3 (three) times daily as needed.  . Insulin Glargine (LANTUS SOLOSTAR) 100 UNIT/ML Solostar Pen Inject 40 Units into the skin at  bedtime.  Marland Kitchen lisinopril (PRINIVIL,ZESTRIL) 40 MG tablet Take 40 mg by mouth daily.  . metoCLOPramide (REGLAN) 10 MG tablet Take 10 mg by mouth 4 (four) times daily as needed for nausea or vomiting.   Marland Kitchen omega-3 acid ethyl esters (LOVAZA) 1 G capsule Take 1 g by mouth 2 (two) times daily. OMACOR  . ondansetron (ZOFRAN) 4 MG tablet Take 4 mg by mouth every 8 (eight) hours as needed for nausea or vomiting.  . temazepam (RESTORIL) 30 MG capsule Take 30 mg by mouth at bedtime as needed for sleep.  Marland Kitchen tiZANidine (ZANAFLEX) 4 MG tablet Take 8 mg by mouth every 8 (eight) hours as needed for muscle spasms.  . Vitamin D, Ergocalciferol, (DRISDOL) 50000 UNITS CAPS capsule Take 50,000 Units by mouth 2 (two) times a week. Sunday and Thursday  . [DISCONTINUED] glucagon (GLUCAGEN HYPOKIT) 1 MG SOLR injection Inject 1 mg into the vein once as needed for low blood sugar.  . [DISCONTINUED] insulin aspart protamine - aspart (NOVOLOG MIX 70/30 FLEXPEN) (70-30) 100 UNIT/ML FlexPen Inject 15-21 Units into the skin 3 (three) times daily with meals.   No facility-administered encounter medications on file as of 02/27/2017.    ALLERGIES: Allergies  Allergen Reactions  . Ibuprofen  Shortness Of Breath and Rash  . Ciprofloxacin Hives  . Metformin And Related Other (See Comments)    MD told patient not to take  . Hydrocodone-Acetaminophen Rash   VACCINATION STATUS:  There is no immunization history on file for this patient.  Diabetes  She presents for her follow-up diabetic visit. She has type 2 diabetes mellitus. Onset time: She was diagnosed at approximate age of 46 years. Her disease course has been worsening. There are no hypoglycemic associated symptoms. Pertinent negatives for hypoglycemia include no confusion, headaches, pallor or seizures. Associated symptoms include blurred vision, fatigue, foot paresthesias, polydipsia and polyuria. Pertinent negatives for diabetes include no chest pain and no polyphagia. There  are no hypoglycemic complications. (She is alarmingly noncompliant. She walks in with no meter nor logs.) Symptoms are worsening. Diabetic complications include peripheral neuropathy and retinopathy. Risk factors for coronary artery disease include diabetes mellitus, dyslipidemia, family history, hypertension, obesity and sedentary lifestyle. Current diabetic treatment includes intensive insulin program. Her weight is stable. She is following a generally unhealthy diet. When asked about meal planning, she reported none. She has not had a previous visit with a dietitian. She never participates in exercise. (She denies hypoglycemic episodes.) An ACE inhibitor/angiotensin II receptor blocker is being taken. Eye exam is current.  Hyperlipidemia  This is a chronic problem. The current episode started more than 1 year ago. Exacerbating diseases include diabetes and obesity. Pertinent negatives include no chest pain, myalgias or shortness of breath. Risk factors for coronary artery disease include diabetes mellitus, dyslipidemia, hypertension, obesity and a sedentary lifestyle.  Hypertension  This is a chronic problem. The current episode started more than 1 year ago. Associated symptoms include blurred vision. Pertinent negatives include no chest pain, headaches, palpitations or shortness of breath. Risk factors for coronary artery disease include dyslipidemia, diabetes mellitus, obesity and sedentary lifestyle. Past treatments include ACE inhibitors. Hypertensive end-organ damage includes retinopathy.     Review of Systems  Constitutional: Positive for fatigue. Negative for chills, fever and unexpected weight change.  HENT: Negative for trouble swallowing and voice change.   Eyes: Positive for blurred vision. Negative for visual disturbance.  Respiratory: Negative for cough, shortness of breath and wheezing.   Cardiovascular: Negative for chest pain, palpitations and leg swelling.  Gastrointestinal:  Negative for diarrhea, nausea and vomiting.  Endocrine: Positive for polydipsia and polyuria. Negative for cold intolerance, heat intolerance and polyphagia.  Musculoskeletal: Negative for arthralgias and myalgias.  Skin: Negative for color change, pallor, rash and wound.  Neurological: Negative for seizures and headaches.  Psychiatric/Behavioral: Negative for confusion and suicidal ideas.    Objective:    BP (!) 140/95   Pulse (!) 105   Ht 5' 6.25" (1.683 m)   Wt 227 lb (103 kg)   BMI 36.36 kg/m   Wt Readings from Last 3 Encounters:  02/27/17 227 lb (103 kg)  10/26/16 229 lb (103.9 kg)  10/14/16 223 lb (101.2 kg)    Physical Exam  Constitutional: She is oriented to person, place, and time. She appears well-developed.  Walks with a cane due to peripheral neuropathy.  HENT:  Head: Normocephalic and atraumatic.  Eyes: EOM are normal.  Neck: Normal range of motion. Neck supple. No tracheal deviation present. No thyromegaly present.  Cardiovascular: Normal rate and regular rhythm.   Pulmonary/Chest: Effort normal and breath sounds normal.  Abdominal: Soft. Bowel sounds are normal. There is no tenderness. There is no guarding.  Musculoskeletal: Normal range of motion. She exhibits no edema.  Neurological: She is alert and oriented to person, place, and time. She has normal reflexes. No cranial nerve deficit. Coordination normal.  Skin: Skin is warm and dry. No rash noted. No erythema. No pallor.  Psychiatric: She has a normal mood and affect. Judgment normal.   Recent Results (from the past 2160 hour(s))  Basic metabolic panel     Status: None   Collection Time: 02/16/17 12:00 AM  Result Value Ref Range   BUN 5 4 - 21   Creatinine 0.6 0.5 - 1.1  Lipid panel     Status: Abnormal   Collection Time: 02/16/17 12:00 AM  Result Value Ref Range   Triglycerides 188 (A) 40 - 160   Cholesterol 169 0 - 200   HDL 38 35 - 70   LDL Cholesterol 94   Hemoglobin A1c     Status: None    Collection Time: 02/16/17 12:00 AM  Result Value Ref Range   Hemoglobin A1C 11.9      CMP     Component Value Date/Time   NA 133 (L) 10/19/2014 1542   K 2.9 (L) 10/19/2014 1542   CL 100 (L) 10/19/2014 1542   CO2 20 (L) 10/19/2014 1542   GLUCOSE 239 (H) 10/19/2014 1542   BUN 5 02/16/2017   CREATININE 0.6 02/16/2017   CREATININE 0.61 10/19/2014 1542   CALCIUM 9.3 10/19/2014 1542   GFRNONAA >60 10/19/2014 1542   GFRAA >60 10/19/2014 1542   A1c from 04/04/ 2018 was 11%   Assessment & Plan:   1. Uncontrolled type 1 diabetes mellitus with other specified complication, with long-term current use of insulin (HCC)  - She comes in with no meter nor logs to review today. Patient has currently uncontrolled, worsening, symptomatic type 1 DM since  46 years of age,  with most recent A1c of 11.9 %, increasing from 11%.  Recent labs reviewed.   Her diabetes is complicated by an alarming noncompliance/nonadherence, peripheral neuropathy, bilateral retinopathy, obesity/sedentary life and patient remains at a high risk for more acute and chronic complications of diabetes which include CAD, CVA, CKD, retinopathy, and neuropathy. These are all discussed in detail with the patient.  - I have counseled the patient on diet management and weight loss, by adopting a carbohydrate restricted/protein rich diet.  - Suggestion is made for her to avoid simple carbohydrates  from her diet including Cakes, Sweet Desserts, Ice Cream, Soda (diet and regular), Sweet Tea, Candies, Chips, Cookies, Store Bought Juices, Alcohol in Excess of  1-2 drinks a day, Artificial Sweeteners, and "Sugar-free" Products. This will help patient to have stable blood glucose profile and potentially avoid unintended weight gain.  - I encouraged the patient to switch to  unprocessed or minimally processed complex starch and increased protein intake (animal or plant source), fruits, and vegetables.  - Patient is advised to stick to a  routine mealtimes to eat 3 meals  a day and avoid unnecessary snacks ( to snack only to correct hypoglycemia).    - I have approached patient with the following individualized plan to manage diabetes and patient agrees:   - She reports multiple episodes of diabetic ketoacidosis in the past, at least once in the last year. - Given her current significant glycemic burden, she will continue to need intensive treatment with basal/bolus insulin. - Unfortunately, patient is alarmingly noncompliant. - I urged her to resume her basal insulin Lantus at 40 units daily at bedtime,  NovoLog  15 units 3 times a day before  meals for pre-meal BG readings of 90-150mg /dl, plus patient specific correction dose for unexpected hyperglycemia above /dl, associated with strict monitoring of glucose  4 times a day-before meals and at bedtime. - Patient is warned not to take insulin without proper monitoring per orders. -Adjustment parameters are given for hypo and hyperglycemia in writing. -Patient is encouraged to call clinic for blood glucose levels less than 70 or above 300 mg /dl. - She will need additional studies to classify her diabetes properly, anti-islet cell antibodies are negative. She will need anti-GAD antibodies on subsequent visits.  - If she has type 2 diabetes rather than type I, she may benefit from either none insulin therapeutic options. - Patient specific target  A1c;  LDL, HDL, Triglycerides, and  Waist Circumference were discussed in detail.  2) BP/HTN: controlled. Continue current medications including ACEI/ARB. 3) Lipids/HPL: Control unknown.   Patient is advised to continue statins. 4)  Weight/Diet: CDE Consult has been initiated , exercise, and detailed carbohydrates information provided.  5) Chronic Care/Health Maintenance:  -Patient is on ACEI/ARB and Statin medications and encouraged to continue to follow up with Ophthalmology, Podiatrist at least yearly or according to  recommendations, and advised to   stay away from smoking. I have recommended yearly flu vaccine and pneumonia vaccination at least every 5 years; moderate intensity exercise for up to 150 minutes weekly; and  sleep for at least 7 hours a day.  - Time spent with the patient: 25 min, of which >50% was spent in reviewing her  current and previous labs , discussing her hypo- and hyper-glycemic episodes, and developing a plan to avoid hypo- and hyper-glycemia.   - Patient to bring meter and  blood glucose logs during her next visit.   - I advised patient to maintain close follow up with Dr. Margo Common for primary care needs.  Follow up plan: - Return in about 1 week (around 03/06/2017) for follow up with meter and logs- no labs.  Marquis Lunch, MD Phone: [phone]  Fax: [phone]  This note was partially dictated with voice recognition software. Similar sounding words can be transcribed inadequately or may not  be corrected upon review.  02/27/2017, 3:36 PM

## 2017-03-09 ENCOUNTER — Ambulatory Visit (INDEPENDENT_AMBULATORY_CARE_PROVIDER_SITE_OTHER): Payer: Medicare HMO | Admitting: "Endocrinology

## 2017-03-09 ENCOUNTER — Encounter: Payer: Self-pay | Admitting: "Endocrinology

## 2017-03-09 VITALS — BP 145/94 | HR 101 | Ht 66.25 in | Wt 230.0 lb

## 2017-03-09 DIAGNOSIS — E1042 Type 1 diabetes mellitus with diabetic polyneuropathy: Secondary | ICD-10-CM

## 2017-03-09 DIAGNOSIS — E1065 Type 1 diabetes mellitus with hyperglycemia: Secondary | ICD-10-CM

## 2017-03-09 DIAGNOSIS — I10 Essential (primary) hypertension: Secondary | ICD-10-CM

## 2017-03-09 DIAGNOSIS — Z6839 Body mass index (BMI) 39.0-39.9, adult: Secondary | ICD-10-CM

## 2017-03-09 DIAGNOSIS — Z9119 Patient's noncompliance with other medical treatment and regimen: Secondary | ICD-10-CM

## 2017-03-09 DIAGNOSIS — IMO0002 Reserved for concepts with insufficient information to code with codable children: Secondary | ICD-10-CM

## 2017-03-09 DIAGNOSIS — Z91199 Patient's noncompliance with other medical treatment and regimen due to unspecified reason: Secondary | ICD-10-CM

## 2017-03-09 DIAGNOSIS — E782 Mixed hyperlipidemia: Secondary | ICD-10-CM | POA: Diagnosis not present

## 2017-03-09 MED ORDER — FREESTYLE LIBRE SENSOR SYSTEM MISC: 2 refills | Status: AC

## 2017-03-09 MED ORDER — FREESTYLE LIBRE READER DEVI: 1.0000 | Freq: Once | 0 refills | Status: AC

## 2017-03-09 NOTE — Progress Notes (Signed)
Subjective:    Patient ID: Deanna Stokes, female    DOB: 1970-08-26. Patient is being seen in f/u  for management of diabetes .  Past Medical History:  Diagnosis Date  . Anxiety   . Depression   . Diabetes mellitus without complication (HCC)   . Hypertension   . Neuropathy    Past Surgical History:  Procedure Laterality Date  . ABDOMINAL HYSTERECTOMY    . CHOLECYSTECTOMY    . TUBAL LIGATION     Social History   Social History  . Marital status: Single    Spouse name: N/A  . Number of children: N/A  . Years of education: N/A   Social History Main Topics  . Smoking status: Never Smoker  . Smokeless tobacco: Never Used  . Alcohol use Yes  . Drug use: No  . Sexual activity: Not Asked   Other Topics Concern  . None   Social History Narrative  . None   Outpatient Encounter Prescriptions as of 03/09/2017  Medication Sig  . amLODipine (NORVASC) 5 MG tablet Take 5 mg by mouth daily.   Marland Kitchen aspirin EC 81 MG tablet Take 81 mg by mouth daily.  Marland Kitchen atorvastatin (LIPITOR) 20 MG tablet Take 20 mg by mouth daily.  . cloNIDine (CATAPRES) 0.1 MG tablet Take 0.1 mg by mouth 2 (two) times daily.  . Continuous Blood Gluc Receiver (FREESTYLE LIBRE READER) DEVI 1 Piece by Does not apply route once.  . Continuous Blood Gluc Sensor (FREESTYLE LIBRE SENSOR SYSTEM) MISC Use one sensor every 10 days.  . DULoxetine (CYMBALTA) 60 MG capsule Take 60 mg by mouth daily.  Marland Kitchen esomeprazole (NEXIUM) 40 MG capsule Take 40 mg by mouth daily at 12 noon.  Marland Kitchen estradiol (CLIMARA - DOSED IN MG/24 HR) 0.075 mg/24hr patch Place 0.075 mg onto the skin once a week.  . furosemide (LASIX) 20 MG tablet Take 20 mg by mouth.  . gabapentin (NEURONTIN) 600 MG tablet Take 600 mg by mouth 3 (three) times daily.  Marland Kitchen glucagon (GLUCAGON EMERGENCY) 1 MG injection Inject 1 mg into the vein once as needed.  . hydrOXYzine (ATARAX/VISTARIL) 25 MG tablet Take 25 mg by mouth 3 (three) times daily as needed.  . insulin aspart  (NOVOLOG FLEXPEN) 100 UNIT/ML FlexPen Inject 15-21 Units into the skin 3 (three) times daily with meals.  . Insulin Glargine (LANTUS SOLOSTAR) 100 UNIT/ML Solostar Pen Inject 50 Units into the skin at bedtime.  Marland Kitchen lisinopril (PRINIVIL,ZESTRIL) 40 MG tablet Take 40 mg by mouth daily.  . metoCLOPramide (REGLAN) 10 MG tablet Take 10 mg by mouth 4 (four) times daily as needed for nausea or vomiting.   Marland Kitchen omega-3 acid ethyl esters (LOVAZA) 1 G capsule Take 1 g by mouth 2 (two) times daily. OMACOR  . ondansetron (ZOFRAN) 4 MG tablet Take 4 mg by mouth every 8 (eight) hours as needed for nausea or vomiting.  . temazepam (RESTORIL) 30 MG capsule Take 30 mg by mouth at bedtime as needed for sleep.  Marland Kitchen tiZANidine (ZANAFLEX) 4 MG tablet Take 8 mg by mouth every 8 (eight) hours as needed for muscle spasms.  . Vitamin D, Ergocalciferol, (DRISDOL) 50000 UNITS CAPS capsule Take 50,000 Units by mouth 2 (two) times a week. Sunday and Thursday   No facility-administered encounter medications on file as of 03/09/2017.    ALLERGIES: Allergies  Allergen Reactions  . Ibuprofen Shortness Of Breath and Rash  . Ciprofloxacin Hives  . Metformin And Related Other (See  Comments)    MD told patient not to take  . Hydrocodone-Acetaminophen Rash   VACCINATION STATUS:  There is no immunization history on file for this patient.  Diabetes  She presents for her follow-up diabetic visit. She has type 2 diabetes mellitus. Onset time: She was diagnosed at approximate age of 31 years. Her disease course has been worsening. There are no hypoglycemic associated symptoms. Pertinent negatives for hypoglycemia include no confusion, headaches, pallor or seizures. Associated symptoms include blurred vision, fatigue, foot paresthesias, polydipsia and polyuria. Pertinent negatives for diabetes include no chest pain and no polyphagia. There are no hypoglycemic complications. (She is alarmingly noncompliant. She walks in with no meter nor  logs.) Symptoms are worsening. Diabetic complications include peripheral neuropathy and retinopathy. Risk factors for coronary artery disease include diabetes mellitus, dyslipidemia, family history, hypertension, obesity and sedentary lifestyle. Current diabetic treatment includes intensive insulin program. Her weight is stable. She is following a generally unhealthy diet. When asked about meal planning, she reported none. She has not had a previous visit with a dietitian. She never participates in exercise. Her breakfast blood glucose range is generally >200 mg/dl. Her lunch blood glucose range is generally >200 mg/dl. Her dinner blood glucose range is generally >200 mg/dl. Her overall blood glucose range is >200 mg/dl. (She denies hypoglycemic episodes.) An ACE inhibitor/angiotensin II receptor blocker is being taken. Eye exam is current.  Hyperlipidemia  This is a chronic problem. The current episode started more than 1 year ago. Exacerbating diseases include diabetes and obesity. Pertinent negatives include no chest pain, myalgias or shortness of breath. Risk factors for coronary artery disease include diabetes mellitus, dyslipidemia, hypertension, obesity and a sedentary lifestyle.  Hypertension  This is a chronic problem. The current episode started more than 1 year ago. Associated symptoms include blurred vision. Pertinent negatives include no chest pain, headaches, palpitations or shortness of breath. Risk factors for coronary artery disease include dyslipidemia, diabetes mellitus, obesity and sedentary lifestyle. Past treatments include ACE inhibitors. Hypertensive end-organ damage includes retinopathy.     Review of Systems  Constitutional: Positive for fatigue. Negative for chills, fever and unexpected weight change.  HENT: Negative for trouble swallowing and voice change.   Eyes: Positive for blurred vision. Negative for visual disturbance.  Respiratory: Negative for cough, shortness of  breath and wheezing.   Cardiovascular: Negative for chest pain, palpitations and leg swelling.  Gastrointestinal: Negative for diarrhea, nausea and vomiting.  Endocrine: Positive for polydipsia and polyuria. Negative for cold intolerance, heat intolerance and polyphagia.  Musculoskeletal: Negative for arthralgias and myalgias.  Skin: Negative for color change, pallor, rash and wound.  Neurological: Negative for seizures and headaches.  Psychiatric/Behavioral: Negative for confusion and suicidal ideas.    Objective:    BP (!) 145/94   Pulse (!) 101   Ht 5' 6.25" (1.683 m)   Wt 230 lb (104.3 kg)   BMI 36.84 kg/m   Wt Readings from Last 3 Encounters:  03/09/17 230 lb (104.3 kg)  02/27/17 227 lb (103 kg)  10/26/16 229 lb (103.9 kg)    Physical Exam  Constitutional: She is oriented to person, place, and time. She appears well-developed.  Walks with a cane due to peripheral neuropathy.  HENT:  Head: Normocephalic and atraumatic.  Eyes: EOM are normal.  Neck: Normal range of motion. Neck supple. No tracheal deviation present. No thyromegaly present.  Cardiovascular: Normal rate and regular rhythm.   Pulmonary/Chest: Effort normal and breath sounds normal.  Abdominal: Soft. Bowel sounds are  normal. There is no tenderness. There is no guarding.  Musculoskeletal: Normal range of motion. She exhibits no edema.  Neurological: She is alert and oriented to person, place, and time. She has normal reflexes. No cranial nerve deficit. Coordination normal.  Skin: Skin is warm and dry. No rash noted. No erythema. No pallor.  Psychiatric: She has a normal mood and affect. Judgment normal.   Recent Results (from the past 2160 hour(s))  Basic metabolic panel     Status: None   Collection Time: 02/16/17 12:00 AM  Result Value Ref Range   BUN 5 4 - 21   Creatinine 0.6 0.5 - 1.1  Lipid panel     Status: Abnormal   Collection Time: 02/16/17 12:00 AM  Result Value Ref Range   Triglycerides 188 (A)  40 - 160   Cholesterol 169 0 - 200   HDL 38 35 - 70   LDL Cholesterol 94   Hemoglobin A1c     Status: None   Collection Time: 02/16/17 12:00 AM  Result Value Ref Range   Hemoglobin A1C 11.9   TSH     Status: None   Collection Time: 02/16/17 12:00 AM  Result Value Ref Range   TSH 4.85 0.41 - 5.90    Comment: Free T4 1.13     CMP     Component Value Date/Time   NA 133 (L) 10/19/2014 1542   K 2.9 (L) 10/19/2014 1542   CL 100 (L) 10/19/2014 1542   CO2 20 (L) 10/19/2014 1542   GLUCOSE 239 (H) 10/19/2014 1542   BUN 5 02/16/2017   CREATININE 0.6 02/16/2017   CREATININE 0.61 10/19/2014 1542   CALCIUM 9.3 10/19/2014 1542   GFRNONAA >60 10/19/2014 1542   GFRAA >60 10/19/2014 1542   A1c from 04/04/ 2018 was 11%   Assessment & Plan:   1. Uncontrolled type 1 diabetes mellitus with other specified complication, with long-term current use of insulin (HCC)  - She comes in with No meter, her logs to review show significantly above target blood glucose profile.  - Patient has currently uncontrolled, worsening, symptomatic type 1 DM since  46 years of age,  with most recent A1c of 11.9 %, increasing from 11%.  Recent labs reviewed.   Her diabetes is complicated by an alarming noncompliance/nonadherence, peripheral neuropathy, bilateral retinopathy, obesity/sedentary life and patient remains at a high risk for more acute and chronic complications of diabetes which include CAD, CVA, CKD, retinopathy, and neuropathy. These are all discussed in detail with the patient.  - I have counseled the patient on diet management and weight loss, by adopting a carbohydrate restricted/protein rich diet.  Suggestion is made for her to avoid simple carbohydrates  from her diet including Cakes, Sweet Desserts, Ice Cream, Soda (diet and regular), Sweet Tea, Candies, Chips, Cookies, Store Bought Juices, Alcohol in Excess of  1-2 drinks a day, Artificial Sweeteners, and "Sugar-free" Products. This will help  patient to have stable blood glucose profile and potentially avoid unintended weight gain.   - I encouraged the patient to switch to  unprocessed or minimally processed complex starch and increased protein intake (animal or plant source), fruits, and vegetables.  - Patient is advised to stick to a routine mealtimes to eat 3 meals  a day and avoid unnecessary snacks ( to snack only to correct hypoglycemia).    - I have approached patient with the following individualized plan to manage diabetes and patient agrees:   - She reports multiple episodes  of diabetic ketoacidosis in the past, at least once in the last year. - Given her current significant glycemic burden, she will continue to need intensive treatment with basal/bolus insulin. - Unfortunately, patient is alarmingly noncompliant. - I urged her to be consistent and increase her basal insulin Lantus to 50 units daily at bedtime,  NovoLog  15 units 3 times a day before meals for pre-meal BG readings of 90-150mg /dl, plus patient specific correction dose for unexpected hyperglycemia above /dl, associated with strict monitoring of glucose  4 times a day-before meals and at bedtime. - She will benefit from continued glucose monitoring. I discussed and initiated a prescription for the FreeStyle Libre device for her.  - Patient is warned not to take insulin without proper monitoring per orders. -Adjustment parameters are given for hypo and hyperglycemia in writing. -Patient is encouraged to call clinic for blood glucose levels less than 70 or above 300 mg /dl. - She will need additional studies to classify her diabetes properly, anti-islet cell antibodies are negative. She will need anti-GAD antibodies on subsequent visits.  - If she has type 2 diabetes rather than type I, she may benefit from either none insulin therapeutic options. - Patient specific target  A1c;  LDL, HDL, Triglycerides, and  Waist Circumference were discussed in  detail.  2) BP/HTN: uncontrolled. She admits she has not been consistent taking her blood pressure medications for couple of days. I advised her to be consistent and  Continue current medications including ACEI/ARB.  3) Lipids/HPL:  controlled, LDL 94 from 02/16/2017.   Patient is advised to continue statins. 4)  Weight/Diet: CDE Consult has been initiated , exercise, and detailed carbohydrates information provided.  5) Chronic Care/Health Maintenance:  -Patient is on ACEI/ARB and Statin medications and encouraged to continue to follow up with Ophthalmology, Podiatrist at least yearly or according to recommendations, and advised to   stay away from smoking. I have recommended yearly flu vaccine and pneumonia vaccination at least every 5 years; moderate intensity exercise for up to 150 minutes weekly; and  sleep for at least 7 hours a day.  - Time spent with the patient: 25 min, of which >50% was spent in reviewing her sugar logs , discussing her hypo- and hyper-glycemic episodes, reviewing her current and  previous labs and insulin doses and developing a plan to avoid hypo- and hyper-glycemia.   - I advised patient to maintain close follow up with Dr. Margo Common for primary care needs.  Follow up plan: - Return in about 2 weeks (around 03/23/2017) for follow up with meter and logs- no labs.  Marquis Lunch, MD Phone: [phone]  Fax: [phone]  This note was partially dictated with voice recognition software. Similar sounding words can be transcribed inadequately or may not  be corrected upon review.  03/09/2017, 3:21 PM

## 2017-03-09 NOTE — Patient Instructions (Signed)

## 2017-03-23 ENCOUNTER — Ambulatory Visit (INDEPENDENT_AMBULATORY_CARE_PROVIDER_SITE_OTHER): Payer: Medicare HMO | Admitting: "Endocrinology

## 2017-03-23 ENCOUNTER — Encounter: Payer: Self-pay | Admitting: "Endocrinology

## 2017-03-23 VITALS — BP 141/91 | Ht 66.25 in | Wt 240.0 lb

## 2017-03-23 DIAGNOSIS — E1065 Type 1 diabetes mellitus with hyperglycemia: Secondary | ICD-10-CM | POA: Diagnosis not present

## 2017-03-23 DIAGNOSIS — E782 Mixed hyperlipidemia: Secondary | ICD-10-CM

## 2017-03-23 DIAGNOSIS — I10 Essential (primary) hypertension: Secondary | ICD-10-CM | POA: Diagnosis not present

## 2017-03-23 DIAGNOSIS — IMO0002 Reserved for concepts with insufficient information to code with codable children: Secondary | ICD-10-CM

## 2017-03-23 DIAGNOSIS — E1042 Type 1 diabetes mellitus with diabetic polyneuropathy: Secondary | ICD-10-CM | POA: Diagnosis not present

## 2017-03-23 MED ORDER — INSULIN GLARGINE 100 UNIT/ML SOLOSTAR PEN: 60.0000 [IU] | PEN_INJECTOR | Freq: Every day | SUBCUTANEOUS | 2 refills | Status: DC

## 2017-03-23 NOTE — Progress Notes (Signed)
Subjective:    Patient ID: Deanna Stokes, female    DOB: 1970-11-15. Patient is being seen in f/u  for management of diabetes .  Past Medical History:  Diagnosis Date  . Anxiety   . Depression   . Diabetes mellitus without complication (HCC)   . Hypertension   . Neuropathy    Past Surgical History:  Procedure Laterality Date  . ABDOMINAL HYSTERECTOMY    . CHOLECYSTECTOMY    . TUBAL LIGATION     Social History   Social History  . Marital status: Single    Spouse name: N/A  . Number of children: N/A  . Years of education: N/A   Social History Main Topics  . Smoking status: Never Smoker  . Smokeless tobacco: Never Used  . Alcohol use Yes  . Drug use: No  . Sexual activity: Not Asked   Other Topics Concern  . None   Social History Narrative  . None   Outpatient Encounter Prescriptions as of 03/23/2017  Medication Sig  . amLODipine (NORVASC) 5 MG tablet Take 5 mg by mouth daily.   Marland Kitchen aspirin EC 81 MG tablet Take 81 mg by mouth daily.  Marland Kitchen atorvastatin (LIPITOR) 20 MG tablet Take 20 mg by mouth daily.  . cloNIDine (CATAPRES) 0.1 MG tablet Take 0.1 mg by mouth 2 (two) times daily.  . Continuous Blood Gluc Sensor (FREESTYLE LIBRE SENSOR SYSTEM) MISC Use one sensor every 10 days.  . DULoxetine (CYMBALTA) 60 MG capsule Take 60 mg by mouth daily.  Marland Kitchen esomeprazole (NEXIUM) 40 MG capsule Take 40 mg by mouth daily at 12 noon.  Marland Kitchen estradiol (CLIMARA - DOSED IN MG/24 HR) 0.075 mg/24hr patch Place 0.075 mg onto the skin once a week.  . furosemide (LASIX) 20 MG tablet Take 20 mg by mouth.  . gabapentin (NEURONTIN) 600 MG tablet Take 600 mg by mouth 3 (three) times daily.  Marland Kitchen glucagon (GLUCAGON EMERGENCY) 1 MG injection Inject 1 mg into the vein once as needed.  . hydrOXYzine (ATARAX/VISTARIL) 25 MG tablet Take 25 mg by mouth 3 (three) times daily as needed.  . insulin aspart (NOVOLOG FLEXPEN) 100 UNIT/ML FlexPen Inject 15-21 Units into the skin 3 (three) times daily with meals.   . Insulin Glargine (LANTUS SOLOSTAR) 100 UNIT/ML Solostar Pen Inject 60 Units into the skin at bedtime.  Marland Kitchen lisinopril (PRINIVIL,ZESTRIL) 40 MG tablet Take 40 mg by mouth daily.  . metoCLOPramide (REGLAN) 10 MG tablet Take 10 mg by mouth 4 (four) times daily as needed for nausea or vomiting.   Marland Kitchen omega-3 acid ethyl esters (LOVAZA) 1 G capsule Take 1 g by mouth 2 (two) times daily. OMACOR  . ondansetron (ZOFRAN) 4 MG tablet Take 4 mg by mouth every 8 (eight) hours as needed for nausea or vomiting.  . temazepam (RESTORIL) 30 MG capsule Take 30 mg by mouth at bedtime as needed for sleep.  Marland Kitchen tiZANidine (ZANAFLEX) 4 MG tablet Take 8 mg by mouth every 8 (eight) hours as needed for muscle spasms.  . Vitamin D, Ergocalciferol, (DRISDOL) 50000 UNITS CAPS capsule Take 50,000 Units by mouth 2 (two) times a week. Sunday and Thursday  . [DISCONTINUED] Insulin Glargine (LANTUS SOLOSTAR) 100 UNIT/ML Solostar Pen Inject 60 Units into the skin at bedtime.   No facility-administered encounter medications on file as of 03/23/2017.    ALLERGIES: Allergies  Allergen Reactions  . Ibuprofen Shortness Of Breath and Rash  . Ciprofloxacin Hives  . Metformin And Related Other (  See Comments)    MD told patient not to take  . Hydrocodone-Acetaminophen Rash   VACCINATION STATUS:  There is no immunization history on file for this patient.  Diabetes  She presents for her follow-up diabetic visit. She has type 2 diabetes mellitus. Onset time: She was diagnosed at approximate age of 31 years. Her disease course has been worsening. There are no hypoglycemic associated symptoms. Pertinent negatives for hypoglycemia include no confusion, headaches, pallor or seizures. Associated symptoms include blurred vision, fatigue, foot paresthesias, polydipsia and polyuria. Pertinent negatives for diabetes include no chest pain and no polyphagia. There are no hypoglycemic complications. (She is alarmingly noncompliant. She walks in with  no meter nor logs.) Symptoms are worsening. Diabetic complications include peripheral neuropathy and retinopathy. Risk factors for coronary artery disease include diabetes mellitus, dyslipidemia, family history, hypertension, obesity and sedentary lifestyle. Current diabetic treatment includes intensive insulin program. Her weight is increasing steadily. She is following a generally unhealthy diet. When asked about meal planning, she reported none. She has not had a previous visit with a dietitian. She never participates in exercise. Her overall blood glucose range is >200 mg/dl. (She denies hypoglycemic episodes. Her meter shows only 2 readings per day, however she documented as if she is monitoring 4 times a day. Her average blood glucose is still high at 252.) An ACE inhibitor/angiotensin II receptor blocker is being taken. Eye exam is current.  Hyperlipidemia  This is a chronic problem. The current episode started more than 1 year ago. Exacerbating diseases include diabetes and obesity. Pertinent negatives include no chest pain, myalgias or shortness of breath. Risk factors for coronary artery disease include diabetes mellitus, dyslipidemia, hypertension, obesity and a sedentary lifestyle.  Hypertension  This is a chronic problem. The current episode started more than 1 year ago. Associated symptoms include blurred vision. Pertinent negatives include no chest pain, headaches, palpitations or shortness of breath. Risk factors for coronary artery disease include dyslipidemia, diabetes mellitus, obesity and sedentary lifestyle. Past treatments include ACE inhibitors. Hypertensive end-organ damage includes retinopathy.     Review of Systems  Constitutional: Positive for fatigue. Negative for chills, fever and unexpected weight change.  HENT: Negative for trouble swallowing and voice change.   Eyes: Positive for blurred vision. Negative for visual disturbance.  Respiratory: Negative for cough, shortness  of breath and wheezing.   Cardiovascular: Negative for chest pain, palpitations and leg swelling.  Gastrointestinal: Negative for diarrhea, nausea and vomiting.  Endocrine: Positive for polydipsia and polyuria. Negative for cold intolerance, heat intolerance and polyphagia.  Musculoskeletal: Negative for arthralgias and myalgias.  Skin: Negative for color change, pallor, rash and wound.  Neurological: Negative for seizures and headaches.  Psychiatric/Behavioral: Negative for confusion and suicidal ideas.    Objective:    BP (!) 141/91   Ht 5' 6.25" (1.683 m)   Wt 240 lb (108.9 kg)   BMI 38.45 kg/m   Wt Readings from Last 3 Encounters:  03/23/17 240 lb (108.9 kg)  03/09/17 230 lb (104.3 kg)  02/27/17 227 lb (103 kg)    Physical Exam  Constitutional: She is oriented to person, place, and time. She appears well-developed.  Walks with a cane due to peripheral neuropathy.  HENT:  Head: Normocephalic and atraumatic.  Eyes: EOM are normal.  Neck: Normal range of motion. Neck supple. No tracheal deviation present. No thyromegaly present.  Cardiovascular: Normal rate and regular rhythm.   Pulmonary/Chest: Effort normal and breath sounds normal.  Abdominal: Soft. Bowel sounds are normal.  There is no tenderness. There is no guarding.  Musculoskeletal: Normal range of motion. She exhibits no edema.  Neurological: She is alert and oriented to person, place, and time. She has normal reflexes. No cranial nerve deficit. Coordination normal.  Skin: Skin is warm and dry. No rash noted. No erythema. No pallor.  Psychiatric: She has a normal mood and affect. Judgment normal.   Recent Results (from the past 2160 hour(s))  Basic metabolic panel     Status: None   Collection Time: 02/16/17 12:00 AM  Result Value Ref Range   BUN 5 4 - 21   Creatinine 0.6 0.5 - 1.1  Lipid panel     Status: Abnormal   Collection Time: 02/16/17 12:00 AM  Result Value Ref Range   Triglycerides 188 (A) 40 - 160    Cholesterol 169 0 - 200   HDL 38 35 - 70   LDL Cholesterol 94   Hemoglobin A1c     Status: None   Collection Time: 02/16/17 12:00 AM  Result Value Ref Range   Hemoglobin A1C 11.9   TSH     Status: None   Collection Time: 02/16/17 12:00 AM  Result Value Ref Range   TSH 4.85 0.41 - 5.90    Comment: Free T4 1.13     CMP     Component Value Date/Time   NA 133 (L) 10/19/2014 1542   K 2.9 (L) 10/19/2014 1542   CL 100 (L) 10/19/2014 1542   CO2 20 (L) 10/19/2014 1542   GLUCOSE 239 (H) 10/19/2014 1542   BUN 5 02/16/2017   CREATININE 0.6 02/16/2017   CREATININE 0.61 10/19/2014 1542   CALCIUM 9.3 10/19/2014 1542   GFRNONAA >60 10/19/2014 1542   GFRAA >60 10/19/2014 1542   A1c from 04/04/ 2018 was 11%   Assessment & Plan:   1. Uncontrolled type 1 diabetes mellitus with other specified complication, with long-term current use of insulin (HCC)  - She comes in with No meter, her logs to review show significantly above target blood glucose profile.  - Patient has currently uncontrolled, worsening, symptomatic type 1 DM since  46 years of age,  with most recent A1c of 11.9 %, increasing from 11%.   - She came with suspicious log completed by blood glucose readings which are not in her meter, she says she has only one meter.  Recent labs reviewed.   Her diabetes is complicated by an alarming noncompliance/nonadherence, peripheral neuropathy, bilateral retinopathy, obesity/sedentary life and patient remains at a high risk for more acute and chronic complications of diabetes which include CAD, CVA, CKD, retinopathy, and neuropathy. These are all discussed in detail with the patient.  - I have counseled the patient on diet management and weight loss, by adopting a carbohydrate restricted/protein rich diet.  -  Suggestion is made for her to avoid simple carbohydrates  from her diet including Cakes, Sweet Desserts / Pastries, Ice Cream, Soda (diet and regular), Sweet Tea, Candies, Chips,  Cookies, Store Bought Juices, Alcohol in Excess of  1-2 drinks a day, Artificial Sweeteners, and "Sugar-free" Products. This will help patient to have stable blood glucose profile and potentially avoid unintended weight gain.  - I encouraged the patient to switch to  unprocessed or minimally processed complex starch and increased protein intake (animal or plant source), fruits, and vegetables.  - Patient is advised to stick to a routine mealtimes to eat 3 meals  a day and avoid unnecessary snacks ( to snack only to  correct hypoglycemia).    - I have approached patient with the following individualized plan to manage diabetes and patient agrees:   - She reports multiple episodes of diabetic ketoacidosis in the past, at least once in the last year. - She did not have any recent hypoglycemic episodes. - Given her current significant glycemic burden, she will continue to need intensive treatment with basal/bolus insulin. - Unfortunately, patient is alarmingly noncompliant, did not monitor enough. This makes it difficult to adjust her treatment. - I urged her to be consistent and increase her basal insulin Lantus to 60 units daily at bedtime,  continue NovoLog  15 units 3 times a day before meals for pre-meal BG readings of 90-150mg /dl, plus patient specific correction dose for unexpected hyperglycemia above 150mg /dl, associated with strict monitoring of glucose  4 times a day-before meals and at bedtime. - She will benefit from continued glucose monitoring. I discussed and initiated a prescription for the FreeStyle Libre device for her.  - Patient is warned not to take insulin without proper monitoring per orders. -Adjustment parameters are given for hypo and hyperglycemia in writing. -Patient is encouraged to call clinic for blood glucose levels less than 70 or above 300 mg /dl. - She will need additional studies to classify her diabetes properly, anti-islet cell antibodies are negative. She will need  anti-GAD antibodies on subsequent visits.  - If she has type 2 diabetes rather than type I, she may benefit from either none insulin therapeutic options. - Patient specific target  A1c;  LDL, HDL, Triglycerides, and  Waist Circumference were discussed in detail.  2) BP/HTN: uncontrolled, but better She admits she has not been consistent taking her blood pressure medications. I advised her to be consistent and  Continue current medications including ACEI/ARB.  3) Lipids/HPL:  controlled, LDL 94 from 02/16/2017.   Patient is advised to continue statins. 4)  Weight/Diet: CDE Consult has been initiated , exercise, and detailed carbohydrates information provided.  5) Chronic Care/Health Maintenance:  -Patient is on ACEI/ARB and Statin medications and encouraged to continue to follow up with Ophthalmology, Podiatrist at least yearly or according to recommendations, and advised to   stay away from smoking. I have recommended yearly flu vaccine and pneumonia vaccination at least every 5 years; moderate intensity exercise for up to 150 minutes weekly; and  sleep for at least 7 hours a day.  - Time spent with the patient: 25 min, of which >50% was spent in reviewing her sugar logs , discussing her hypo- and hyper-glycemic episodes, reviewing her current and  previous labs and insulin doses and developing a plan to avoid hypo- and hyper-glycemia.    - I advised patient to maintain close follow up with Dr. Margo Common for primary care needs.  Follow up plan: - Return in about 9 weeks (around 05/25/2017) for follow up with pre-visit labs, meter, and logs.  Marquis Lunch, MD Phone: [phone]  Fax: [phone]  This note was partially dictated with voice recognition software. Similar sounding words can be transcribed inadequately or may not  be corrected upon review.  03/23/2017, 4:02 PM

## 2017-03-23 NOTE — Patient Instructions (Signed)

## 2017-05-25 ENCOUNTER — Encounter: Payer: Self-pay | Admitting: "Endocrinology

## 2017-05-25 ENCOUNTER — Ambulatory Visit: Payer: Medicare HMO | Admitting: "Endocrinology

## 2017-06-13 ENCOUNTER — Other Ambulatory Visit: Payer: Self-pay | Admitting: "Endocrinology

## 2017-07-14 ENCOUNTER — Other Ambulatory Visit: Payer: Self-pay

## 2017-07-14 ENCOUNTER — Emergency Department (HOSPITAL_COMMUNITY): Payer: Medicare HMO

## 2017-07-14 ENCOUNTER — Encounter (HOSPITAL_COMMUNITY): Payer: Self-pay | Admitting: Emergency Medicine

## 2017-07-14 ENCOUNTER — Observation Stay (HOSPITAL_COMMUNITY)
Admission: EM | Admit: 2017-07-14 | Discharge: 2017-07-16 | Disposition: A | Discharge status: Home / Self Care | Payer: Medicare HMO | Attending: Internal Medicine | Admitting: Internal Medicine

## 2017-07-14 DIAGNOSIS — Z7982 Long term (current) use of aspirin: Secondary | ICD-10-CM | POA: Insufficient documentation

## 2017-07-14 DIAGNOSIS — E782 Mixed hyperlipidemia: Secondary | ICD-10-CM | POA: Diagnosis not present

## 2017-07-14 DIAGNOSIS — E1165 Type 2 diabetes mellitus with hyperglycemia: Secondary | ICD-10-CM | POA: Diagnosis not present

## 2017-07-14 DIAGNOSIS — R1013 Epigastric pain: Secondary | ICD-10-CM | POA: Insufficient documentation

## 2017-07-14 DIAGNOSIS — E1143 Type 2 diabetes mellitus with diabetic autonomic (poly)neuropathy: Secondary | ICD-10-CM | POA: Diagnosis not present

## 2017-07-14 DIAGNOSIS — Z79899 Other long term (current) drug therapy: Secondary | ICD-10-CM | POA: Diagnosis not present

## 2017-07-14 DIAGNOSIS — R1110 Vomiting, unspecified: Secondary | ICD-10-CM | POA: Diagnosis present

## 2017-07-14 DIAGNOSIS — R1114 Bilious vomiting: Principal | ICD-10-CM | POA: Insufficient documentation

## 2017-07-14 DIAGNOSIS — Z794 Long term (current) use of insulin: Secondary | ICD-10-CM | POA: Diagnosis not present

## 2017-07-14 DIAGNOSIS — IMO0002 Reserved for concepts with insufficient information to code with codable children: Secondary | ICD-10-CM | POA: Diagnosis present

## 2017-07-14 DIAGNOSIS — R739 Hyperglycemia, unspecified: Secondary | ICD-10-CM

## 2017-07-14 DIAGNOSIS — I10 Essential (primary) hypertension: Secondary | ICD-10-CM | POA: Insufficient documentation

## 2017-07-14 DIAGNOSIS — R112 Nausea with vomiting, unspecified: Secondary | ICD-10-CM | POA: Diagnosis not present

## 2017-07-14 DIAGNOSIS — K3184 Gastroparesis: Secondary | ICD-10-CM

## 2017-07-14 LAB — I-STAT CHEM 8, ED
BUN: 10 mg/dL (ref 6–20)
Calcium, Ion: 1.12 mmol/L — ABNORMAL LOW (ref 1.15–1.40)
Chloride: 105 mmol/L (ref 101–111)
Creatinine, Ser: 0.4 mg/dL — ABNORMAL LOW (ref 0.44–1.00)
GLUCOSE: 262 mg/dL — AB (ref 65–99)
HCT: 34 % — ABNORMAL LOW (ref 36.0–46.0)
Hemoglobin: 11.6 g/dL — ABNORMAL LOW (ref 12.0–15.0)
POTASSIUM: 3.5 mmol/L (ref 3.5–5.1)
SODIUM: 139 mmol/L (ref 135–145)
TCO2: 23 mmol/L (ref 22–32)

## 2017-07-14 LAB — URINALYSIS, ROUTINE W REFLEX MICROSCOPIC
Bilirubin Urine: NEGATIVE
Ketones, ur: 80 mg/dL — AB
Nitrite: NEGATIVE
PROTEIN: 100 mg/dL — AB
SPECIFIC GRAVITY, URINE: 1.033 — AB (ref 1.005–1.030)
pH: 6 (ref 5.0–8.0)

## 2017-07-14 LAB — COMPREHENSIVE METABOLIC PANEL
ALBUMIN: 4.3 g/dL (ref 3.5–5.0)
ALK PHOS: 164 U/L — AB (ref 38–126)
ALT: 29 U/L (ref 14–54)
AST: 34 U/L (ref 15–41)
Anion gap: 16 — ABNORMAL HIGH (ref 5–15)
BILIRUBIN TOTAL: 1.3 mg/dL — AB (ref 0.3–1.2)
BUN: 16 mg/dL (ref 6–20)
CALCIUM: 9.5 mg/dL (ref 8.9–10.3)
CO2: 18 mmol/L — AB (ref 22–32)
CREATININE: 0.65 mg/dL (ref 0.44–1.00)
Chloride: 98 mmol/L — ABNORMAL LOW (ref 101–111)
GFR calc Af Amer: >60 mL/min (ref >60)
GFR calc non Af Amer: >60 mL/min (ref >60)
GLUCOSE: 327 mg/dL — AB (ref 65–99)
Potassium: 3.2 mmol/L — ABNORMAL LOW (ref 3.5–5.1)
SODIUM: 132 mmol/L — AB (ref 135–145)
Total Protein: 9.1 g/dL — ABNORMAL HIGH (ref 6.5–8.1)

## 2017-07-14 LAB — CBG MONITORING, ED
GLUCOSE-CAPILLARY: 339 mg/dL — AB (ref 65–99)
GLUCOSE-CAPILLARY: 249 mg/dL — AB (ref 65–99)

## 2017-07-14 LAB — CBC
HCT: 36.3 % (ref 36.0–46.0)
HEMOGLOBIN: 12 g/dL (ref 12.0–15.0)
MCH: 28.2 pg (ref 26.0–34.0)
MCHC: 33.1 g/dL (ref 30.0–36.0)
MCV: 85.2 fL (ref 78.0–100.0)
Platelets: 566 10*3/uL — ABNORMAL HIGH (ref 150–400)
RBC: 4.26 MIL/uL (ref 3.87–5.11)
RDW: 14.4 % (ref 11.5–15.5)
WBC: 20.3 10*3/uL — ABNORMAL HIGH (ref 4.0–10.5)

## 2017-07-14 LAB — LACTIC ACID, PLASMA: LACTIC ACID, VENOUS: 1.2 mmol/L (ref 0.5–1.9)

## 2017-07-14 LAB — LIPASE, BLOOD: Lipase: 18 U/L (ref 11–51)

## 2017-07-14 MED ORDER — IOPAMIDOL (ISOVUE-300) INJECTION 61%
100.0000 mL | Freq: Once | INTRAVENOUS | Status: AC | PRN
  Administered 2017-07-14: 100 mL via INTRAVENOUS

## 2017-07-14 MED ORDER — PROCHLORPERAZINE EDISYLATE 5 MG/ML IJ SOLN
5.0000 mg | Freq: Once | INTRAMUSCULAR | Status: AC
Start: 2017-07-14 — End: 2017-07-14
  Administered 2017-07-14: 5 mg via INTRAVENOUS
  Filled 2017-07-14: qty 2

## 2017-07-14 MED ORDER — ONDANSETRON 4 MG PO TBDP
4.0000 mg | ORAL_TABLET | Freq: Once | ORAL | Status: AC
  Administered 2017-07-14: 4 mg via ORAL
  Filled 2017-07-14: qty 1

## 2017-07-14 MED ORDER — SODIUM CHLORIDE 0.9 % IV BOLUS (SEPSIS)
1000.0000 mL | Freq: Once | INTRAVENOUS | Status: AC
  Administered 2017-07-14: 1000 mL via INTRAVENOUS

## 2017-07-14 MED ORDER — HALOPERIDOL LACTATE 5 MG/ML IJ SOLN
2.0000 mg | Freq: Once | INTRAMUSCULAR | Status: AC
  Administered 2017-07-14: 2 mg via INTRAVENOUS
  Filled 2017-07-14: qty 1

## 2017-07-14 NOTE — ED Notes (Signed)
Patient transported to CT 

## 2017-07-14 NOTE — ED Notes (Signed)
ED provider at bedside.

## 2017-07-14 NOTE — H&P (Signed)
TRH H&P    Patient Demographics:    Deanna Stokes, is a 47 y.o. female  MRN: [id number]  DOB - [birth year]  Admit Date - 07/14/2017  Referring MD/NP/PA: Dr. Jeraldine Loots home Outpatient Primary MD for the patient is Ernestine Conrad, MD  Patient coming from: home  Chief complaint-vomiting   HPI:    Deanna Stokes  is a 47 y.o. female, with history of diabetes mellitus, hypertension, neuropathy, depression came to hospital with complaints of nausea, vomiting and abdominal pain. Symptoms started about four days ago. Patient states that she did not take her insulin as she was not able to keep anything down. She had multiple episodes of vomiting and also complaints of diarrhea. She denies chest pain or shortness of breath. Patient does have a history of gastroparesis. She had cholecystectomy and tubal ligation, abdominal hysterectomy she denies dysuria. Complains of abdominal pain.  CT of the abdomen showed no acute abnormality. Questionable cirrhosis    Review of systems:      All other systems reviewed and are negative.   With Past History of the following :    Past Medical History:  Diagnosis Date  . Anxiety   . Depression   . Diabetes mellitus without complication (HCC)   . Hypertension   . Neuropathy       Past Surgical History:  Procedure Laterality Date  . ABDOMINAL HYSTERECTOMY    . CHOLECYSTECTOMY    . TUBAL LIGATION        Social History:      Social History   Tobacco Use  . Smoking status: Never Smoker  . Smokeless tobacco: Never Used  Substance Use Topics  . Alcohol use: Yes       Family History :   Unknown as patient was adopted   Home Medications:   Prior to Admission medications   Medication Sig Start Date End Date Taking? Authorizing Provider  amLODipine (NORVASC) 5 MG tablet Take 5 mg by mouth daily.  10/10/14  Yes [provider]  aspirin EC 81 MG tablet  Take 81 mg by mouth daily.   Yes [provider]  atorvastatin (LIPITOR) 20 MG tablet Take 20 mg by mouth daily.   Yes [provider]  cloNIDine (CATAPRES) 0.1 MG tablet Take 0.1 mg by mouth 2 (two) times daily.   Yes [provider]  DULoxetine (CYMBALTA) 60 MG capsule Take 60 mg by mouth daily.   Yes [provider]  esomeprazole (NEXIUM) 40 MG capsule Take 40 mg by mouth daily at 12 noon.   Yes [provider]  estradiol (CLIMARA - DOSED IN MG/24 HR) 0.075 mg/24hr patch Place 0.075 mg onto the skin once a week.   Yes [provider]  furosemide (LASIX) 20 MG tablet Take 20 mg by mouth daily.    Yes [provider]  gabapentin (NEURONTIN) 600 MG tablet Take 600 mg by mouth 3 (three) times daily.   Yes [provider]  glucagon (GLUCAGON EMERGENCY) 1 MG injection Inject 1 mg into the vein once as  needed. 10/26/16  Yes Nida, Denman GeorgeGebreselassie W, MD  insulin aspart (NOVOLOG FLEXPEN) 100 UNIT/ML FlexPen Inject 25 Units into the skin 3 (three) times daily with meals.    Yes [provider]  LANTUS SOLOSTAR 100 UNIT/ML Solostar Pen INJECT 60 UNITS INTO THE SKIN AT BEDTIME Patient taking differently: INJECT 60 UNITS INTO THE SKIN TWICE DAILY 06/14/17  Yes Nida, Denman GeorgeGebreselassie W, MD  lisinopril (PRINIVIL,ZESTRIL) 40 MG tablet Take 40 mg by mouth daily.   Yes [provider]  omega-3 acid ethyl esters (LOVAZA) 1 G capsule Take 1 g by mouth 2 (two) times daily. [facility] 10/[facility]  Yes [provider]  temazepam (RESTORIL) 30 MG capsule Take 30 mg by mouth at bedtime as needed for sleep.   Yes [provider]  tiZANidine (ZANAFLEX) 4 MG tablet Take 8 mg by mouth every 8 (eight) hours as needed for muscle spasms.   Yes [provider]  Vitamin D, Ergocalciferol, (DRISDOL) 50000 UNITS CAPS capsule Take 50,000 Units by mouth 2 (two) times a week. Sunday and Thursday   Yes [provider]    buprenorphine (SUBUTEX) 8 MG SUBL SL tablet Place 4 mg under the tongue 2 (two) times daily. 07/11/17   [provider]  cephALEXin (KEFLEX) 500 MG capsule Take 500 mg by mouth 3 (three) times daily. 7 day supply 07/13/17   [provider]  chlorhexidine (PERIDEX) 0.12 % solution  07/04/17   [provider]  Continuous Blood Gluc Sensor (FREESTYLE LIBRE SENSOR SYSTEM) MISC Use one sensor every 10 days. 03/09/17   Nida, Gebreselassie W, MD  FLUoxetine (PROZAC) 20 MG capsule  07/03/17   [provider]  glimepiride (AMARYL) 4 MG tablet  06/27/17   [provider]  NOVOLOG MIX 70/30 FLEXPEN (70-30) 100 UNIT/ML FlexPen  06/27/17   [provider]  zolpidem (AMBIEN) 10 MG tablet  06/23/17   [provider]     Allergies:     Allergies  Allergen Reactions  . Ibuprofen Shortness Of Breath and Rash  . Ciprofloxacin Hives  . Metformin And Related Other (See Comments)    MD told patient not to take  . Hydrocodone-Acetaminophen Rash     Physical Exam:   Vitals  Blood pressure (!) 159/104, pulse (!) 114, temperature 99.1 F (37.3 C), temperature source Oral, resp. rate (!) 22, height 5\' 7" (1.702 m), weight 108 kg (238 lb), SpO2 98 %.  1.  General: appears in no acute distress  2. Psychiatric:  Intact judgement and  insight, awake alert, oriented x 3.  3. Neurologic: No focal neurological deficits, all cranial nerves intact.Strength 5/5 all 4 extremities, sensation intact all 4 extremities, plantars down going.  4. Eyes :  anicteric sclerae, moist conjunctivae with no lid lag. PERRLA.  5. ENMT:  Oropharynx clear with moist mucous membranes and good dentition  6. Neck:  supple, no cervical lymphadenopathy appriciated, No thyromegaly  7. Respiratory : Normal respiratory effort, good air movement bilaterally,clear to  auscultation bilaterally  8. Cardiovascular : RRR, no gallops, rubs or murmurs, no leg edema  9.  Gastrointestinal:  Positive bowel sounds, abdomen soft, generalized tenderness to palpation, no hepatosplenomegaly, no rigidity or guarding       10 . Skin:  No cyanosis, normal texture and turgor, no rash, lesions or ulcers  11.Musculoskeletal:  Good muscle tone,  joints appear normal , no effusions,  normal range of motion    Data Review:    CBC Recent Labs  Lab  07/14/17 1735 07/14/17 2209  WBC 20.3*  --   HGB 12.0 11.6*  HCT 36.3 34.0*  PLT 566*  --   MCV 85.2  --   MCH 28.2  --   MCHC 33.1  --   RDW 14.4  --    ------------------------------------------------------------------------------------------------------------------  Chemistries  Recent Labs  Lab 07/14/17 1735 07/14/17 2209  NA 132* 139  K 3.2* 3.5  CL 98* 105  CO2 18*  --   GLUCOSE 327* 262*  BUN 16 10  CREATININE 0.65 0.40*  CALCIUM 9.5  --   AST 34  --   ALT 29  --   ALKPHOS 164*  --   BILITOT 1.3*  --    ------------------------------------------------------------------------------------------------------------------  ------------------------------------------------------------------------------------------------------------------ GFR: Estimated Creatinine Clearance: 112.4 mL/min (A) (by C-G formula based on SCr of 0.4 mg/dL (L)). Liver Function Tests: Recent Labs  Lab 07/14/17 1735  AST 34  ALT 29  ALKPHOS 164*  BILITOT 1.3*  PROT 9.1*  ALBUMIN 4.3   Recent Labs  Lab 07/14/17 1735  LIPASE 18   CBG: Recent Labs  Lab 07/14/17 1732 07/14/17 2206  GLUCAP 339* 249*    --------------------------------------------------------------------------------------------------------------- Urine analysis:    Component Value Date/Time   COLORURINE YELLOW 07/14/2017 2028   APPEARANCEUR HAZY (A) 07/14/2017 2028   LABSPEC 1.033 (H) 07/14/2017 2028   PHURINE 6.0 07/14/2017 2028   GLUCOSEU >=500 (A) 07/14/2017 2028   HGBUR SMALL (A) 07/14/2017 2028   BILIRUBINUR NEGATIVE 07/14/2017 2028    KETONESUR 80 (A) 07/14/2017 2028   PROTEINUR 100 (A) 07/14/2017 2028   NITRITE NEGATIVE 07/14/2017 2028   LEUKOCYTESUR SMALL (A) 07/14/2017 2028      Imaging Results:    Ct Abdomen Pelvis W Contrast  Result Date: 07/14/2017 CLINICAL DATA:  Small-bowel obstruction. EXAM: CT ABDOMEN AND PELVIS WITH CONTRAST TECHNIQUE: Multidetector CT imaging of the abdomen and pelvis was performed using the standard protocol following bolus administration of intravenous contrast. CONTRAST:  ISOVUE-300 IOPAMIDOL (ISOVUE-300) INJECTION 61% COMPARISON:  CT abdomen pelvis 12/18/2016 FINDINGS: Lower chest: No basilar pulmonary nodules or pleural effusion. No apical pericardial effusion. Hepatobiliary: Heterogeneous appearance of the liver. Status post cholecystectomy. Pancreas: Normal parenchymal contours without ductal dilatation. No peripancreatic fluid collection. Spleen: Normal. Adrenals/Urinary Tract: --Adrenal glands: Normal. --Right kidney/ureter: No hydronephrosis, perinephric stranding or nephrolithiasis. No obstructing ureteral stones. --Left kidney/ureter: No hydronephrosis, perinephric stranding or nephrolithiasis. No obstructing ureteral stones. --Urinary bladder: Normal appearance for the degree of distention. Stomach/Bowel: --Stomach/Duodenum: Small hiatal hernia. --Small bowel: No dilatation or inflammation. --Colon: No focal abnormality. --Appendix: Normal. Vascular/Lymphatic: Normal course and caliber of the major abdominal vessels. No abdominal or pelvic lymphadenopathy. Reproductive: Status post hysterectomy. No adnexal mass. Musculoskeletal. No bony spinal canal stenosis or focal osseous abnormality. Other: None. IMPRESSION: 1. No acute abdominal or pelvic abnormality. 2. Heterogeneous appearance of the liver could indicate early cirrhosis, but is incompletely evaluated with poor contrast bolus. This could be further assessed with ultrasound. 3. Possible contrast extravasation at the right upper  extremity injection site. Electronically Signed   By: Deatra Robinson M.D.   On: 07/14/2017 20:32      Assessment & Plan:    Active Problems:   Essential hypertension, benign   Type II diabetes mellitus, uncontrolled (HCC)   Mixed hyperlipidemia   Intractable vomiting   1. Intractable nausea and vomiting- likely from underlying gastroparesis. Start Reglan 10 mg IV Q8 hours. Zofran PRN for nausea vomiting. 2. Diabetes mellitus-continue Lantus and reduced dose of 30 units  sub Q daily. Sliding scale insulin with NovoLog. Check CBG Q6 hours. Anion gap is 11. CO2 is 23, seen on istat chem 8   results in the ED 3. Hypertension-continue amlodipine, clonidine. Hold lisinopril at this time. Hydralazine 10 mg IV Q4 hours PRN for BP more than 160/100 4. Depression-restart Cymbalta once patient is able to take PO. 5. History of opiod abuse-patient is on buprenorphine, will hold this medication due to intractable nausea vomiting. Consider restarting it in a.m. if patient is symptomatically better.   DVT Prophylaxis-   Lovenox   AM Labs Ordered, also please review Full Orders  Family Communication: Admission, patients condition and plan of care including tests being ordered have been discussed with the patient  who indicate understanding and agree with the plan and Code Status.  Code Status: full code  Admission status: observation  Time spent in minutes : 60 minutes   Meredeth Ide M.D on 07/14/2017 at 10:49 PM  Between 7am to 7pm - Pager - [phone]. After 7pm go to [email or web address] - password [facility]  [facility] - Office  [phone]

## 2017-07-14 NOTE — ED Triage Notes (Signed)
Pt c/o fo N/V/D for four days. Denies fever. Pt states not being able to take any of her medications due to vomiting. Pt was placed on new medication for pain 5 days and states "it makes me sick"

## 2017-07-14 NOTE — ED Provider Notes (Signed)
[facility] EMERGENCY DEPARTMENT Provider Note   CSN: [id number] Arrival date & time: 07/14/17  1713     History   Chief Complaint Chief Complaint  Patient presents with  . Emesis    HPI Deanna Stokes is a 47 y.o. female.  HPI  Presents with concern of nausea, vomiting, abdominal pain. Onset was about 4 days ago, since onset symptoms have been persistent Patient has been seen at another emergency department during this illness, notes that she was discharged from that facility, but continues to have the aforementioned discomfort. The pain is focally about the mid center abdomen. She has a history of prior abdominal surgery, from injury as a child. She also has a history of gastroparesis. During this illness no clear alleviating or exacerbating factors, as the symptoms seem to be just getting worse according to her. No fever, no difficulty breathing, no chest pain. She is here with her daughter-in-law who assists with the HPI.  Past Medical History:  Diagnosis Date  . Anxiety   . Depression   . Diabetes mellitus without complication (HCC)   . Hypertension   . Neuropathy     Patient Active Problem List   Diagnosis Date Noted  . Personal history of noncompliance with medical treatment, presenting hazards to health 03/09/2017  . Mixed hyperlipidemia 02/27/2017  . Uncontrolled type 1 diabetes mellitus with diabetic polyneuropathy (HCC) 02/27/2017  . Type II diabetes mellitus, uncontrolled (HCC) 10/14/2016  . Class 2 severe obesity due to excess calories with serious comorbidity and body mass index (BMI) of 39.0 to 39.9 in adult (HCC) 10/14/2016  . COUGH 01/08/2008  . DEPRESSION 01/07/2008  . Essential hypertension, benign 01/07/2008  . G E R D 01/07/2008    Past Surgical History:  Procedure Laterality Date  . ABDOMINAL HYSTERECTOMY    . CHOLECYSTECTOMY    . TUBAL LIGATION      OB History    No data available       Home Medications    Prior to Admission  medications   Medication Sig Start Date End Date Taking? Authorizing Provider  amLODipine (NORVASC) 5 MG tablet Take 5 mg by mouth daily.  10/10/14   [provider]  aspirin EC 81 MG tablet Take 81 mg by mouth daily.    [provider]  atorvastatin (LIPITOR) 20 MG tablet Take 20 mg by mouth daily.    [provider]  cloNIDine (CATAPRES) 0.1 MG tablet Take 0.1 mg by mouth 2 (two) times daily.    [provider]  Continuous Blood Gluc Sensor (FREESTYLE LIBRE SENSOR SYSTEM) MISC Use one sensor every 10 days. 03/09/17   Roma Kayser, MD  DULoxetine (CYMBALTA) 60 MG capsule Take 60 mg by mouth daily.    [provider]  esomeprazole (NEXIUM) 40 MG capsule Take 40 mg by mouth daily at 12 noon.    [provider]  estradiol (CLIMARA - DOSED IN MG/24 HR) 0.075 mg/24hr patch Place 0.075 mg onto the skin once a week.    [provider]  furosemide (LASIX) 20 MG tablet Take 20 mg by mouth.    [provider]  gabapentin (NEURONTIN) 600 MG tablet Take 600 mg by mouth 3 (three) times daily.    [provider]  glucagon (GLUCAGON EMERGENCY) 1 MG injection Inject 1 mg into the vein once as needed. 10/26/16   Roma Kayser, MD  hydrOXYzine (ATARAX/VISTARIL) 25 MG tablet Take 25 mg by mouth 3 (three) times daily  as needed.    [provider]  insulin aspart (NOVOLOG FLEXPEN) 100 UNIT/ML FlexPen Inject 15-21 Units into the skin 3 (three) times daily with meals.    [provider]  LANTUS SOLOSTAR 100 UNIT/ML Solostar Pen INJECT 60 UNITS INTO THE SKIN AT BEDTIME 06/14/17   Roma Kayser, MD  lisinopril (PRINIVIL,ZESTRIL) 40 MG tablet Take 40 mg by mouth daily.    [provider]  metoCLOPramide (REGLAN) 10 MG tablet Take 10 mg by mouth 4 (four) times daily as needed for nausea or vomiting.  07/14/14   [provider]  omega-3 acid ethyl esters (LOVAZA) 1 G capsule Take 1 g by mouth  2 (two) times daily. [facility] 10/16/14   [provider]  ondansetron (ZOFRAN) 4 MG tablet Take 4 mg by mouth every 8 (eight) hours as needed for nausea or vomiting.    [provider]  temazepam (RESTORIL) 30 MG capsule Take 30 mg by mouth at bedtime as needed for sleep.    [provider]  tiZANidine (ZANAFLEX) 4 MG tablet Take 8 mg by mouth every 8 (eight) hours as needed for muscle spasms.    [provider]  Vitamin D, Ergocalciferol, (DRISDOL) 50000 UNITS CAPS capsule Take 50,000 Units by mouth 2 (two) times a week. Sunday and Thursday    [provider]    Family History No family history on file.  Social History Social History   Tobacco Use  . Smoking status: Never Smoker  . Smokeless tobacco: Never Used  Substance Use Topics  . Alcohol use: Yes  . Drug use: No     Allergies   Ibuprofen; Ciprofloxacin; Metformin and related; and Hydrocodone-acetaminophen   Review of Systems Review of Systems  Constitutional:       Per HPI, otherwise negative  HENT:       Per HPI, otherwise negative  Respiratory:       Per HPI, otherwise negative  Cardiovascular:       Per HPI, otherwise negative  Gastrointestinal: Positive for abdominal pain, nausea and vomiting.  Endocrine:       Negative aside from HPI  Genitourinary:       Neg aside from HPI   Musculoskeletal:       Per HPI, otherwise negative  Skin: Negative.   Neurological: Negative for syncope.     Physical Exam Updated Vital Signs BP (!) 172/118 (BP Location: Right Arm)   Pulse (!) 117   Temp 98.6 F (37 C) (Oral)   Resp (!) 24   Ht 5\' 7"  (1.702 m)   Wt 108 kg (238 lb)   SpO2 100%   BMI 37.28 kg/m   Physical Exam  Constitutional: She is oriented to person, place, and time. She appears well-developed and well-nourished.  Uncomfortable appearing obese female  HENT:  Head: Normocephalic and atraumatic.  Eyes: Conjunctivae and EOM are normal.  Cardiovascular:  Normal rate and regular rhythm.  Pulmonary/Chest: Effort normal and breath sounds normal. No stridor. No respiratory distress.  Abdominal: She exhibits no distension. There is generalized tenderness and tenderness in the epigastric area and periumbilical area. There is guarding.    Musculoskeletal: She exhibits no edema.  Neurological: She is alert and oriented to person, place, and time. No cranial nerve deficit.  Skin: Skin is warm and dry.  Psychiatric: She has a normal mood and affect.  Nursing note and vitals reviewed.    ED Treatments / Results  Labs (all labs ordered are listed,  but only abnormal results are displayed) Labs Reviewed  COMPREHENSIVE METABOLIC PANEL - Abnormal; Notable for the following components:      Result Value   Sodium 132 (*)    Potassium 3.2 (*)    Chloride 98 (*)    CO2 18 (*)    Glucose, Bld 327 (*)    Total Protein 9.1 (*)    Alkaline Phosphatase 164 (*)    Total Bilirubin 1.3 (*)    Anion gap 16 (*)    All other components within normal limits  CBC - Abnormal; Notable for the following components:   WBC 20.3 (*)    Platelets 566 (*)    All other components within normal limits  URINALYSIS, ROUTINE W REFLEX MICROSCOPIC - Abnormal; Notable for the following components:   APPearance HAZY (*)    Specific Gravity, Urine 1.033 (*)    Glucose, UA >=500 (*)    Hgb urine dipstick SMALL (*)    Ketones, ur 80 (*)    Protein, ur 100 (*)    Leukocytes, UA SMALL (*)    Bacteria, UA RARE (*)    Squamous Epithelial / LPF 0-5 (*)    All other components within normal limits  CBG MONITORING, ED - Abnormal; Notable for the following components:   Glucose-Capillary 339 (*)    All other components within normal limits  LIPASE, BLOOD    EKG  EKG Interpretation None       Radiology  I reviewed the CT images myself, agree with the interpretation. The formal radiology interpretation has not crossed over from our other computer system. No acute  findings, some evidence for possible early cirrhosis.     Procedures Procedures (including critical care time)  Medications Ordered in ED Medications  ondansetron (ZOFRAN-ODT) disintegrating tablet 4 mg (4 mg Oral Given 07/14/17 1755)  haloperidol lactate (HALDOL) injection 2 mg (2 mg Intravenous Given 07/14/17 1846)     Initial Impression / Assessment and Plan / ED Course  I have reviewed the triage vital signs and the nursing notes.  Pertinent labs & imaging results that were available during my care of the patient were reviewed by me and considered in my medical decision making (see chart for details).    9:40 PM Following 2 L fluid resuscitation patient remains tachycardic. She has leukocytosis, but no fever, no obvious source of infection.  She remains nauseous, intolerant of p.o. Labs notable for hyperglycemia, with substantial ketonuria. The patient's CT abdomen pelvis was reassuring given her persistent nausea, vomiting, p.o. intolerance and her evidence for dehydration with substantial ketonuria, patient will be admitted for further evaluation, management, monitoring, including continued IV fluids.   Final Clinical Impressions(s) / ED Diagnoses  Persistent nausea, vomiting Abdominal pain   Gerhard MunchLockwood, Quinne Pires, MD 07/14/17 2141

## 2017-07-14 NOTE — ED Notes (Signed)
EDP at bedside  

## 2017-07-15 DIAGNOSIS — E1165 Type 2 diabetes mellitus with hyperglycemia: Secondary | ICD-10-CM

## 2017-07-15 DIAGNOSIS — K3184 Gastroparesis: Secondary | ICD-10-CM

## 2017-07-15 DIAGNOSIS — I10 Essential (primary) hypertension: Secondary | ICD-10-CM | POA: Diagnosis not present

## 2017-07-15 DIAGNOSIS — R1013 Epigastric pain: Secondary | ICD-10-CM | POA: Diagnosis not present

## 2017-07-15 DIAGNOSIS — R112 Nausea with vomiting, unspecified: Secondary | ICD-10-CM

## 2017-07-15 DIAGNOSIS — E1143 Type 2 diabetes mellitus with diabetic autonomic (poly)neuropathy: Secondary | ICD-10-CM

## 2017-07-15 DIAGNOSIS — R1114 Bilious vomiting: Secondary | ICD-10-CM | POA: Diagnosis not present

## 2017-07-15 LAB — COMPREHENSIVE METABOLIC PANEL
ALT: 25 U/L (ref 14–54)
ANION GAP: 13 (ref 5–15)
AST: 29 U/L (ref 15–41)
Albumin: 3.8 g/dL (ref 3.5–5.0)
Alkaline Phosphatase: 148 U/L — ABNORMAL HIGH (ref 38–126)
BUN: 9 mg/dL (ref 6–20)
CHLORIDE: 103 mmol/L (ref 101–111)
CO2: 21 mmol/L — ABNORMAL LOW (ref 22–32)
Calcium: 8.7 mg/dL — ABNORMAL LOW (ref 8.9–10.3)
Creatinine, Ser: 0.45 mg/dL (ref 0.44–1.00)
Glucose, Bld: 228 mg/dL — ABNORMAL HIGH (ref 65–99)
POTASSIUM: 3.2 mmol/L — AB (ref 3.5–5.1)
Sodium: 137 mmol/L (ref 135–145)
Total Bilirubin: 1.1 mg/dL (ref 0.3–1.2)
Total Protein: 8.2 g/dL — ABNORMAL HIGH (ref 6.5–8.1)

## 2017-07-15 LAB — GLUCOSE, CAPILLARY
GLUCOSE-CAPILLARY: 219 mg/dL — AB (ref 65–99)
GLUCOSE-CAPILLARY: 259 mg/dL — AB (ref 65–99)
GLUCOSE-CAPILLARY: 191 mg/dL — AB (ref 65–99)
Glucose-Capillary: 231 mg/dL — ABNORMAL HIGH (ref 65–99)
Glucose-Capillary: 237 mg/dL — ABNORMAL HIGH (ref 65–99)

## 2017-07-15 LAB — CBC
HCT: 35.7 % — ABNORMAL LOW (ref 36.0–46.0)
Hemoglobin: 11.5 g/dL — ABNORMAL LOW (ref 12.0–15.0)
MCH: 27.4 pg (ref 26.0–34.0)
MCHC: 32.2 g/dL (ref 30.0–36.0)
MCV: 85.2 fL (ref 78.0–100.0)
PLATELETS: 513 10*3/uL — AB (ref 150–400)
RBC: 4.19 MIL/uL (ref 3.87–5.11)
RDW: 14.3 % (ref 11.5–15.5)
WBC: 14.8 10*3/uL — ABNORMAL HIGH (ref 4.0–10.5)

## 2017-07-15 LAB — HEMOGLOBIN A1C
HEMOGLOBIN A1C: 11.3 % — AB (ref 4.8–5.6)
Mean Plasma Glucose: 277.61 mg/dL

## 2017-07-15 LAB — LACTIC ACID, PLASMA: Lactic Acid, Venous: 1.1 mmol/L (ref 0.5–1.9)

## 2017-07-15 MED ORDER — ENOXAPARIN SODIUM 40 MG/0.4ML ~~LOC~~ SOLN
40.0000 mg | SUBCUTANEOUS | Status: DC
  Administered 2017-07-15 – 2017-07-16 (×2): 40 mg via SUBCUTANEOUS
  Filled 2017-07-15 (×2): qty 0.4

## 2017-07-15 MED ORDER — INSULIN ASPART 100 UNIT/ML ~~LOC~~ SOLN
0.0000 [IU] | Freq: Three times a day (TID) | SUBCUTANEOUS | Status: DC
  Administered 2017-07-15: 5 [IU] via SUBCUTANEOUS
  Administered 2017-07-15: 2 [IU] via SUBCUTANEOUS
  Administered 2017-07-15: 3 [IU] via SUBCUTANEOUS
  Administered 2017-07-16: 2 [IU] via SUBCUTANEOUS
  Administered 2017-07-16: 5 [IU] via SUBCUTANEOUS

## 2017-07-15 MED ORDER — GI COCKTAIL ~~LOC~~
30.0000 mL | Freq: Three times a day (TID) | ORAL | Status: DC | PRN
  Administered 2017-07-15 – 2017-07-16 (×3): 30 mL via ORAL
  Filled 2017-07-15 (×3): qty 30

## 2017-07-15 MED ORDER — CLONIDINE HCL 0.1 MG PO TABS
0.1000 mg | ORAL_TABLET | Freq: Two times a day (BID) | ORAL | Status: DC
  Administered 2017-07-15 – 2017-07-16 (×4): 0.1 mg via ORAL
  Filled 2017-07-15 (×4): qty 1

## 2017-07-15 MED ORDER — SODIUM CHLORIDE 0.9 % IV SOLN
INTRAVENOUS | Status: DC
  Administered 2017-07-15 (×3): via INTRAVENOUS

## 2017-07-15 MED ORDER — BUPRENORPHINE HCL-NALOXONE HCL 8-2 MG SL SUBL
1.0000 | SUBLINGUAL_TABLET | Freq: Two times a day (BID) | SUBLINGUAL | Status: DC | PRN
Start: 2017-07-15 — End: 2017-07-16
  Administered 2017-07-15 – 2017-07-16 (×3): 1 via SUBLINGUAL
  Filled 2017-07-15 (×3): qty 1

## 2017-07-15 MED ORDER — AMLODIPINE BESYLATE 5 MG PO TABS
5.0000 mg | ORAL_TABLET | Freq: Every day | ORAL | Status: DC
  Administered 2017-07-15 – 2017-07-16 (×2): 5 mg via ORAL
  Filled 2017-07-15 (×2): qty 1

## 2017-07-15 MED ORDER — INSULIN GLARGINE 100 UNIT/ML ~~LOC~~ SOLN
45.0000 [IU] | Freq: Every day | SUBCUTANEOUS | Status: DC
  Administered 2017-07-15: 45 [IU] via SUBCUTANEOUS
  Filled 2017-07-15 (×2): qty 0.45

## 2017-07-15 MED ORDER — DULOXETINE HCL 60 MG PO CPEP
60.0000 mg | ORAL_CAPSULE | Freq: Every day | ORAL | Status: DC
  Administered 2017-07-15 – 2017-07-16 (×2): 60 mg via ORAL
  Filled 2017-07-15 (×2): qty 1

## 2017-07-15 MED ORDER — INSULIN GLARGINE 100 UNIT/ML ~~LOC~~ SOLN
30.0000 [IU] | Freq: Every day | SUBCUTANEOUS | Status: DC
  Administered 2017-07-15: 30 [IU] via SUBCUTANEOUS
  Filled 2017-07-15 (×3): qty 0.3

## 2017-07-15 MED ORDER — ONDANSETRON HCL 4 MG PO TABS
4.0000 mg | ORAL_TABLET | Freq: Four times a day (QID) | ORAL | Status: DC | PRN
  Administered 2017-07-15: 4 mg via ORAL
  Filled 2017-07-15: qty 1

## 2017-07-15 MED ORDER — ALUM & MAG HYDROXIDE-SIMETH 200-200-20 MG/5ML PO SUSP
15.0000 mL | Freq: Once | ORAL | Status: AC
  Administered 2017-07-15: 15 mL via ORAL
  Filled 2017-07-15: qty 30

## 2017-07-15 MED ORDER — PANTOPRAZOLE SODIUM 40 MG PO TBEC
40.0000 mg | DELAYED_RELEASE_TABLET | Freq: Every day | ORAL | Status: DC
  Administered 2017-07-15 – 2017-07-16 (×2): 40 mg via ORAL
  Filled 2017-07-15 (×2): qty 1

## 2017-07-15 MED ORDER — METOCLOPRAMIDE HCL 5 MG/ML IJ SOLN
10.0000 mg | Freq: Three times a day (TID) | INTRAMUSCULAR | Status: DC
  Administered 2017-07-15 (×4): 10 mg via INTRAVENOUS
  Filled 2017-07-15 (×4): qty 2

## 2017-07-15 MED ORDER — ONDANSETRON HCL 4 MG/2ML IJ SOLN
4.0000 mg | Freq: Four times a day (QID) | INTRAMUSCULAR | Status: DC | PRN
Start: 2017-07-15 — End: 2017-07-16

## 2017-07-15 NOTE — Plan of Care (Signed)
Pt experienced multiple episodes of dry heaves, denied emesis duration of shift. Will continue to monitor.

## 2017-07-15 NOTE — Progress Notes (Signed)
[facility] PROGRESS NOTE  Deanna Stokes ZOX:[id number] DOB: 03/10/1971 DOA: 07/14/2017 PCP: Ernestine ConradBluth, Kirk, MD  Interim summary and HPI 47 y.o. female, with history of diabetes mellitus, hypertension, neuropathy, depression came to hospital with complaints of nausea, vomiting and abdominal pain. Symptoms started about four days ago. Patient states that she did not take her insulin as she was not able to keep anything down. She had multiple episodes of vomiting. Place in observation for symptoms management of intractable nausea/vomiting in setting of gastroparesis.  Assessment/Plan: 1-intractable nausea and vomiting in the setting of gastroparesis: -CT abdomen and pelvis without acute abnormalities -Will slowly start to advance her diet -Initiate treatment with PPI -Continue Reglan -resume home PRN analgesics -follow clinical response; hopefully able to tolerate diet and meds and home tomorrow -continue IVF's for today  2-uncontrolled type 2 diabetes with gastroparesis and hyperglycemia -Continue sliding scale insulin and Lantus -A1c 11.3.  3-depression: -Will resume Cymbalta.  4-hypertension: -Continue amlodipine and clonidine -PRN hydralazine has also been ordered in the setting of poor oral intake and dehydration we will hold lisinopril for now -Continue IV fluids.  5-history of opioid abuse -resume buprenorphine   Code Status: Full code Family Communication: No family at bedside Disposition Plan: Continue IV fluids, as needed antiemetics, continue IV Reglan, will initiate treatment with PPI and is slowly trying to advance diet.  Adjust insulin dose.   Consultants:  None  Procedures:  See below for x-ray reports.  Antibiotics:  None  HPI/Subjective: Afebrile, no chest pain, no shortness of breath.  Patient complaining of vague abdominal discomfort, nausea and dry heaves.  Objective: Vitals:   07/15/17 0506 07/15/17 1500  BP: 139/84 (!) 142/84  Pulse:  (!) 105 (!) 105  Resp: 17 18  Temp: 98.7 F (37.1 C) 98.1 F (36.7 C)  SpO2: 100% 100%    Intake/Output Summary (Last 24 hours) at 07/15/2017 1619 Last data filed at 07/15/2017 1528 Gross per 24 hour  Intake 4904.16 ml  Output 1000 ml  Net 3904.16 ml   Filed Weights   07/14/17 1729 07/15/17 0011  Weight: 108 kg (238 lb) 104.6 kg (230 lb 11.2 oz)    Exam:   General: Afebrile, no chest pain, no shortness of breath.  Patient still feeling bad, nauseated and with dry heaves.  No further vomiting reported.  Also complaining of reflux symptoms.  Cardiovascular: S1 and S2, no rubs, no gallops, no JVD.  Respiratory: Good air movement bilaterally, no using accessory muscles, no wheezing.  Good O2 sat on room air.  Abdomen: Vague tenderness on palpation, no guarding, soft, positive bowel sounds, no distention.  Musculoskeletal: No edema, no cyanosis, no clubbing.  Data Reviewed: Basic Metabolic Panel: Recent Labs  Lab 07/14/17 1735 07/14/17 2209 07/15/17 0601  NA 132* 139 137  K 3.2* 3.5 3.2*  CL 98* 105 103  CO2 18*  --  21*  GLUCOSE 327* 262* 228*  BUN 16 10 9   CREATININE 0.65 0.40* 0.45  CALCIUM 9.5  --  8.7*   Liver Function Tests: Recent Labs  Lab 07/14/17 1735 07/15/17 0601  AST 34 29  ALT 29 25  ALKPHOS 164* 148*  BILITOT 1.3* 1.1  PROT 9.1* 8.2*  ALBUMIN 4.3 3.8   Recent Labs  Lab 07/14/17 1735  LIPASE 18   CBC: Recent Labs  Lab 07/14/17 1735 07/14/17 2209 07/15/17 0601  WBC 20.3*  --  14.8*  HGB 12.0 11.6* 11.5*  HCT 36.3 34.0* 35.7*  MCV 85.2  --  85.2  PLT 566*  --  513*   CBG: Recent Labs  Lab 07/14/17 1732 07/14/17 2206 07/15/17 0037 07/15/17 0714 07/15/17 1123  GLUCAP 339* 249* 237* 219* 259*   Studies: Ct Abdomen Pelvis W Contrast  Result Date: 07/14/2017 CLINICAL DATA:  Small-bowel obstruction. EXAM: CT ABDOMEN AND PELVIS WITH CONTRAST TECHNIQUE: Multidetector CT imaging of the abdomen and pelvis was performed using the  standard protocol following bolus administration of intravenous contrast. CONTRAST:  ISOVUE-300 IOPAMIDOL (ISOVUE-300) INJECTION 61% COMPARISON:  CT abdomen pelvis 12/18/2016 FINDINGS: Lower chest: No basilar pulmonary nodules or pleural effusion. No apical pericardial effusion. Hepatobiliary: Heterogeneous appearance of the liver. Status post cholecystectomy. Pancreas: Normal parenchymal contours without ductal dilatation. No peripancreatic fluid collection. Spleen: Normal. Adrenals/Urinary Tract: --Adrenal glands: Normal. --Right kidney/ureter: No hydronephrosis, perinephric stranding or nephrolithiasis. No obstructing ureteral stones. --Left kidney/ureter: No hydronephrosis, perinephric stranding or nephrolithiasis. No obstructing ureteral stones. --Urinary bladder: Normal appearance for the degree of distention. Stomach/Bowel: --Stomach/Duodenum: Small hiatal hernia. --Small bowel: No dilatation or inflammation. --Colon: No focal abnormality. --Appendix: Normal. Vascular/Lymphatic: Normal course and caliber of the major abdominal vessels. No abdominal or pelvic lymphadenopathy. Reproductive: Status post hysterectomy. No adnexal mass. Musculoskeletal. No bony spinal canal stenosis or focal osseous abnormality. Other: None. IMPRESSION: 1. No acute abdominal or pelvic abnormality. 2. Heterogeneous appearance of the liver could indicate early cirrhosis, but is incompletely evaluated with poor contrast bolus. This could be further assessed with ultrasound. 3. Possible contrast extravasation at the right upper extremity injection site. Electronically Signed   By: Deatra Robinson M.D.   On: 07/14/2017 20:32    Scheduled Meds: . amLODipine  5 mg Oral Daily  . cloNIDine  0.1 mg Oral BID  . DULoxetine  60 mg Oral Daily  . enoxaparin (LOVENOX) injection  40 mg Subcutaneous Q24H  . insulin aspart  0-9 Units Subcutaneous TID WC  . insulin glargine  30 Units Subcutaneous Q2200  . metoCLOPramide (REGLAN)  injection  10 mg Intravenous Q8H  . pantoprazole  40 mg Oral Daily   Continuous Infusions: . sodium chloride 125 mL/hr at 07/15/17 1613    Time spent: 30 minutes    Vassie Loll  [facility] Pager [phone]. If 7PM-7AM, please contact night-coverage at [email or web address], password [facility] 07/15/2017, 4:19 PM  LOS: 0 days

## 2017-07-16 DIAGNOSIS — R1114 Bilious vomiting: Secondary | ICD-10-CM | POA: Diagnosis not present

## 2017-07-16 DIAGNOSIS — R1013 Epigastric pain: Secondary | ICD-10-CM

## 2017-07-16 DIAGNOSIS — R739 Hyperglycemia, unspecified: Secondary | ICD-10-CM

## 2017-07-16 DIAGNOSIS — I10 Essential (primary) hypertension: Secondary | ICD-10-CM | POA: Diagnosis not present

## 2017-07-16 DIAGNOSIS — E1143 Type 2 diabetes mellitus with diabetic autonomic (poly)neuropathy: Secondary | ICD-10-CM

## 2017-07-16 DIAGNOSIS — R112 Nausea with vomiting, unspecified: Secondary | ICD-10-CM | POA: Diagnosis not present

## 2017-07-16 DIAGNOSIS — E782 Mixed hyperlipidemia: Secondary | ICD-10-CM

## 2017-07-16 DIAGNOSIS — K3184 Gastroparesis: Secondary | ICD-10-CM

## 2017-07-16 LAB — GLUCOSE, CAPILLARY
Glucose-Capillary: 264 mg/dL — ABNORMAL HIGH (ref 65–99)
Glucose-Capillary: 196 mg/dL — ABNORMAL HIGH (ref 65–99)

## 2017-07-16 LAB — HIV ANTIBODY (ROUTINE TESTING W REFLEX): HIV Screen 4th Generation wRfx: NONREACTIVE

## 2017-07-16 MED ORDER — ONDANSETRON 8 MG PO TBDP: 8.0000 mg | ORAL_TABLET | Freq: Three times a day (TID) | ORAL | 0 refills | Status: AC | PRN

## 2017-07-16 MED ORDER — METOCLOPRAMIDE HCL 5 MG/5ML PO SOLN
10.0000 mg | Freq: Three times a day (TID) | ORAL | Status: DC
  Administered 2017-07-16: 10 mg via ORAL
  Filled 2017-07-16: qty 10

## 2017-07-16 MED ORDER — POTASSIUM CHLORIDE CRYS ER 20 MEQ PO TBCR
40.0000 meq | EXTENDED_RELEASE_TABLET | ORAL | Status: AC
  Administered 2017-07-16 (×2): 40 meq via ORAL
  Filled 2017-07-16: qty 2

## 2017-07-16 MED ORDER — INSULIN GLARGINE 100 UNIT/ML SOLOSTAR PEN: 50.0000 [IU] | PEN_INJECTOR | Freq: Two times a day (BID) | SUBCUTANEOUS | Status: AC

## 2017-07-16 MED ORDER — METOCLOPRAMIDE HCL 10 MG PO TABS: 10.0000 mg | ORAL_TABLET | Freq: Three times a day (TID) | ORAL | 1 refills | Status: DC

## 2017-07-16 MED ORDER — ESOMEPRAZOLE MAGNESIUM 40 MG PO CPDR: 40.0000 mg | DELAYED_RELEASE_CAPSULE | Freq: Two times a day (BID) | ORAL | 1 refills | Status: AC

## 2017-07-16 MED ORDER — METOCLOPRAMIDE HCL 5 MG/5ML PO SOLN
10.0000 mg | Freq: Four times a day (QID) | ORAL | Status: DC
  Administered 2017-07-16 (×2): 10 mg via ORAL
  Filled 2017-07-16: qty 10

## 2017-07-16 NOTE — Progress Notes (Signed)
Patient currently has no IV access, IV infiltrated earlier this evening.  There have been multiple attempts at IV by several staff members with no success.  Patient is tolerating liquids well with no vomiting.  Night coverage MD notified.

## 2017-07-16 NOTE — Discharge Summary (Signed)
Physician Discharge Summary  Deanna Stokes MWU:[id number] DOB: [birth year] DOA: 07/14/2017  PCP: Ernestine Conrad, MD  Admit date: 07/14/2017 Discharge date: 07/16/2017  Time spent: 35 minutes minutes  Recommendations for Outpatient Follow-up:  Repeat BMET to follow electrolytes and renal function  Close follow up to patient's CBG and diabetes; adjust hypoglycemic regimen as needed. A1C 11.3 Reassess BP and adjust antihypertensive regimen as needed  Follow up improvement in her gastroparesis and tolerance to Reglan; make referral to GI services if further assistance needed with dysmotility disorder.  Discharge Diagnoses:  Active Problems:   Essential hypertension, benign   Type II diabetes mellitus, uncontrolled (HCC)   Mixed hyperlipidemia   Intractable vomiting   Epigastric pain   Diabetic gastroparesis (HCC) hypokalemia  Discharge Condition: stable and improved. Discharge home with instructions to follow up with PCP in 10 days.  Diet recommendation: heart healthy and modified carbohydrates   Filed Weights   07/14/17 1729 07/15/17 0011  Weight: 108 kg (238 lb) 104.6 kg (230 lb 11.2 oz)    Brief History of present illness:  47 y.o.female,with history of diabetes mellitus, hypertension, neuropathy, depression came to hospital with complaints of nausea, vomiting and abdominal pain. Symptoms started about four days ago. Patient states that she did not take her insulin as she was not able to keep anything down. She had multiple episodes of vomiting. Place in observation for symptoms management of intractable nausea/vomiting in setting of gastroparesis.  Hospital Course:  1-intractable nausea and vomiting in the setting of gastroparesis: -CT abdomen and pelvis without acute abnormalities -patient started on PPI and Reglan -resume home PRN analgesics -PRN zofran ODT prescribed for nausea -instructed to follow multiple small meals -patient able to tolerate diet and PO meds prior to  discharge  2-uncontrolled type 2 diabetes with gastroparesis and hyperglycemia -Continue Amaryl and Lantus -A1c 11.3. -patient needs better blood sugar control -instructed to be compliant with medications, to not skip meals and to closely follow up with her PCP for further adjustment on hypoglycemic regimen.  3-depression: -Resume Cymbalta.  4-hypertension: -Continue amlodipine and clonidine -advise to follow low heart diet -will need reassessment of her BP and adjustment of antihypertensive regimen as needed   5-history of opioid abuse/chronic pain syndrome -resume buprenorphine   6-hypokalemia -associated with GI loses -repleted -repeat BMET at follow up visit recommended   ### Rest of her medical problems remains stable and the plan is to continue current home medication regimen and to follow up with PCP for further adjustments in her therapy.  Procedures:  See below for x-ray reports   Consultations:  None   Discharge Exam: Vitals:   07/15/17 2100 07/16/17 0506  BP: (!) 157/92 (!) 151/95  Pulse: (!) 124 100  Resp: 18 18  Temp: 98.6 F (37 C) 98 F (36.7 C)  SpO2: 99% 100%     General: Afebrile, no chest pain, no shortness of breath.  Patient feeling much better. Denies nausea currently and has not experienced any further vomiting. Tolerated full liquid diet on 2/9 and modified carbohydrates prior to discharge. No CP or SOB.  Cardiovascular: S1 and S2, no rubs, no gallops, no JVD.  Respiratory: Good air movement bilaterally, no using accessory muscles, no wheezing.  Good O2 sat on room air.  Abdomen: Vague tenderness on palpation, no guarding, soft, positive bowel sounds, no distention.  Musculoskeletal: No edema, no cyanosis, no clubbing.     Discharge Instructions   Discharge Instructions    Diet - low sodium heart  healthy   Complete by:  As directed    Diet Carb Modified   Complete by:  As directed    Discharge instructions   Complete by:   As directed    Take medications as prescribed  Keep your well hydrated Do not skipped meals Arrange follow up with PCP in 10 days Follow modified carb diet (small meals multiple times a day)     Allergies as of 07/16/2017      Reactions   Ibuprofen Shortness Of Breath, Rash   Ciprofloxacin Hives   Metformin And Related Other (See Comments)   MD told patient not to take   Hydrocodone-acetaminophen Rash      Medication List    STOP taking these medications   cephALEXin 500 MG capsule Commonly known as:  KEFLEX   chlorhexidine 0.12 % solution Commonly known as:  PERIDEX   NOVOLOG MIX 70/30 FLEXPEN (70-30) 100 UNIT/ML FlexPen Generic drug:  insulin aspart protamine - aspart     TAKE these medications   amLODipine 5 MG tablet Commonly known as:  NORVASC Take 5 mg by mouth daily.   aspirin EC 81 MG tablet Take 81 mg by mouth daily.   atorvastatin 20 MG tablet Commonly known as:  LIPITOR Take 20 mg by mouth daily.   buprenorphine 8 MG Subl SL tablet Commonly known as:  SUBUTEX Place 4 mg under the tongue 2 (two) times daily.   cloNIDine 0.1 MG tablet Commonly known as:  CATAPRES Take 0.1 mg by mouth 2 (two) times daily.   DULoxetine 60 MG capsule Commonly known as:  CYMBALTA Take 60 mg by mouth daily.   esomeprazole 40 MG capsule Commonly known as:  NEXIUM Take 1 capsule (40 mg total) by mouth 2 (two) times daily before a meal. What changed:  when to take this   estradiol 0.075 mg/24hr patch Commonly known as:  CLIMARA - Dosed in mg/24 hr Place 0.075 mg onto the skin once a week.   FLUoxetine 20 MG capsule Commonly known as:  PROZAC Take 20 mg by mouth daily.   FREESTYLE LIBRE SENSOR SYSTEM Misc Use one sensor every 10 days.   furosemide 20 MG tablet Commonly known as:  LASIX Take 20 mg by mouth daily.   gabapentin 600 MG tablet Commonly known as:  NEURONTIN Take 600 mg by mouth 3 (three) times daily.   glimepiride 4 MG tablet Commonly known as:   AMARYL Take 4 mg by mouth daily with breakfast.   glucagon 1 MG injection Commonly known as:  GLUCAGON EMERGENCY Inject 1 mg into the vein once as needed.   Insulin Glargine 100 UNIT/ML Solostar Pen Commonly known as:  LANTUS SOLOSTAR Inject 50 Units into the skin 2 times daily at 12 noon and 4 pm. What changed:  See the new instructions.   lisinopril 40 MG tablet Commonly known as:  PRINIVIL,ZESTRIL Take 40 mg by mouth daily.   metoCLOPramide 10 MG tablet Commonly known as:  REGLAN Take 1 tablet (10 mg total) by mouth 3 (three) times daily before meals for 60 doses.   omega-3 acid ethyl esters 1 g capsule Commonly known as:  LOVAZA Take 1 g by mouth 2 (two) times daily. OMACOR   ondansetron 8 MG disintegrating tablet Commonly known as:  ZOFRAN ODT Take 1 tablet (8 mg total) by mouth every 8 (eight) hours as needed for nausea or vomiting.   temazepam 30 MG capsule Commonly known as:  RESTORIL Take 30 mg by mouth at  bedtime as needed for sleep.   tiZANidine 4 MG tablet Commonly known as:  ZANAFLEX Take 8 mg by mouth every 8 (eight) hours as needed for muscle spasms.   Vitamin D (Ergocalciferol) 50000 units Caps capsule Commonly known as:  DRISDOL Take 50,000 Units by mouth 2 (two) times a week. Sunday and Thursday   zolpidem 10 MG tablet Commonly known as:  AMBIEN Take 10 mg by mouth at bedtime.      Allergies  Allergen Reactions  . Ibuprofen Shortness Of Breath and Rash  . Ciprofloxacin Hives  . Metformin And Related Other (See Comments)    MD told patient not to take  . Hydrocodone-Acetaminophen Rash   Follow-up Information    Ernestine Conrad, MD. Schedule an appointment as soon as possible for a visit in 10 day(s).   Specialty:  Family Medicine Contact information: [address] [place] [address] [phone]          The results of significant diagnostics from this hospitalization (including imaging, microbiology, ancillary and laboratory) are  listed below for reference.    Significant Diagnostic Studies: Ct Abdomen Pelvis W Contrast  Result Date: 07/14/2017 CLINICAL DATA:  Small-bowel obstruction. EXAM: CT ABDOMEN AND PELVIS WITH CONTRAST TECHNIQUE: Multidetector CT imaging of the abdomen and pelvis was performed using the standard protocol following bolus administration of intravenous contrast. CONTRAST:  ISOVUE-300 IOPAMIDOL (ISOVUE-300) INJECTION 61% COMPARISON:  CT abdomen pelvis 12/18/2016 FINDINGS: Lower chest: No basilar pulmonary nodules or pleural effusion. No apical pericardial effusion. Hepatobiliary: Heterogeneous appearance of the liver. Status post cholecystectomy. Pancreas: Normal parenchymal contours without ductal dilatation. No peripancreatic fluid collection. Spleen: Normal. Adrenals/Urinary Tract: --Adrenal glands: Normal. --Right kidney/ureter: No hydronephrosis, perinephric stranding or nephrolithiasis. No obstructing ureteral stones. --Left kidney/ureter: No hydronephrosis, perinephric stranding or nephrolithiasis. No obstructing ureteral stones. --Urinary bladder: Normal appearance for the degree of distention. Stomach/Bowel: --Stomach/Duodenum: Small hiatal hernia. --Small bowel: No dilatation or inflammation. --Colon: No focal abnormality. --Appendix: Normal. Vascular/Lymphatic: Normal course and caliber of the major abdominal vessels. No abdominal or pelvic lymphadenopathy. Reproductive: Status post hysterectomy. No adnexal mass. Musculoskeletal. No bony spinal canal stenosis or focal osseous abnormality. Other: None. IMPRESSION: 1. No acute abdominal or pelvic abnormality. 2. Heterogeneous appearance of the liver could indicate early cirrhosis, but is incompletely evaluated with poor contrast bolus. This could be further assessed with ultrasound. 3. Possible contrast extravasation at the right upper extremity injection site. Electronically Signed   By: Deatra Robinson M.D.   On: 07/14/2017 20:32    Labs: Basic  Metabolic Panel: Recent Labs  Lab 07/14/17 1735 07/14/17 2209 07/15/17 0601  NA 132* 139 137  K 3.2* 3.5 3.2*  CL 98* 105 103  CO2 18*  --  21*  GLUCOSE 327* 262* 228*  BUN 16 10 9   CREATININE 0.65 0.40* 0.45  CALCIUM 9.5  --  8.7*   Liver Function Tests: Recent Labs  Lab 07/14/17 1735 07/15/17 0601  AST 34 29  ALT 29 25  ALKPHOS 164* 148*  BILITOT 1.3* 1.1  PROT 9.1* 8.2*  ALBUMIN 4.3 3.8   Recent Labs  Lab 07/14/17 1735  LIPASE 18   No results for input(s): AMMONIA in the last 168 hours. CBC: Recent Labs  Lab 07/14/17 1735 07/14/17 2209 07/15/17 0601  WBC 20.3*  --  14.8*  HGB 12.0 11.6* 11.5*  HCT 36.3 34.0* 35.7*  MCV 85.2  --  85.2  PLT 566*  --  513*   CBG: Recent Labs  Lab 07/15/17 1123 07/15/17 1652 07/15/17 2024 07/16/17 0736 07/16/17 1131  GLUCAP 259* 191* 231* 264* 196*    Signed:  Vassie Lollarlos Eliah Ozawa MD.  [facility] 07/16/2017, 2:09 PM

## 2017-07-16 NOTE — Progress Notes (Signed)
Discharge instructions gone over with patient, verbalized understanding. Printed prescriptions given to patient.

## 2017-09-11 ENCOUNTER — Inpatient Hospital Stay (HOSPITAL_COMMUNITY)
Admission: EM | Admit: 2017-09-11 | Discharge: 2017-09-13 | DRG: 638 | Disposition: A | Discharge status: Home / Self Care | Payer: Medicare Other | Attending: Internal Medicine | Admitting: Internal Medicine

## 2017-09-11 ENCOUNTER — Emergency Department (HOSPITAL_COMMUNITY): Payer: Medicare Other

## 2017-09-11 ENCOUNTER — Other Ambulatory Visit: Payer: Self-pay

## 2017-09-11 ENCOUNTER — Encounter (HOSPITAL_COMMUNITY): Payer: Self-pay | Admitting: Emergency Medicine

## 2017-09-11 DIAGNOSIS — Z881 Allergy status to other antibiotic agents status: Secondary | ICD-10-CM | POA: Diagnosis not present

## 2017-09-11 DIAGNOSIS — R112 Nausea with vomiting, unspecified: Secondary | ICD-10-CM | POA: Diagnosis present

## 2017-09-11 DIAGNOSIS — K219 Gastro-esophageal reflux disease without esophagitis: Secondary | ICD-10-CM | POA: Diagnosis not present

## 2017-09-11 DIAGNOSIS — Z6839 Body mass index (BMI) 39.0-39.9, adult: Secondary | ICD-10-CM

## 2017-09-11 DIAGNOSIS — I10 Essential (primary) hypertension: Secondary | ICD-10-CM | POA: Diagnosis present

## 2017-09-11 DIAGNOSIS — Z886 Allergy status to analgesic agent status: Secondary | ICD-10-CM | POA: Diagnosis not present

## 2017-09-11 DIAGNOSIS — K3184 Gastroparesis: Secondary | ICD-10-CM | POA: Diagnosis not present

## 2017-09-11 DIAGNOSIS — Z888 Allergy status to other drugs, medicaments and biological substances status: Secondary | ICD-10-CM

## 2017-09-11 DIAGNOSIS — Z6836 Body mass index (BMI) 36.0-36.9, adult: Secondary | ICD-10-CM | POA: Diagnosis not present

## 2017-09-11 DIAGNOSIS — Z885 Allergy status to narcotic agent status: Secondary | ICD-10-CM

## 2017-09-11 DIAGNOSIS — E1165 Type 2 diabetes mellitus with hyperglycemia: Secondary | ICD-10-CM | POA: Diagnosis present

## 2017-09-11 DIAGNOSIS — N10 Acute tubulo-interstitial nephritis: Secondary | ICD-10-CM | POA: Diagnosis not present

## 2017-09-11 DIAGNOSIS — E1110 Type 2 diabetes mellitus with ketoacidosis without coma: Secondary | ICD-10-CM | POA: Diagnosis not present

## 2017-09-11 DIAGNOSIS — F1120 Opioid dependence, uncomplicated: Secondary | ICD-10-CM | POA: Diagnosis not present

## 2017-09-11 DIAGNOSIS — Z794 Long term (current) use of insulin: Secondary | ICD-10-CM

## 2017-09-11 DIAGNOSIS — E1143 Type 2 diabetes mellitus with diabetic autonomic (poly)neuropathy: Secondary | ICD-10-CM | POA: Diagnosis present

## 2017-09-11 DIAGNOSIS — N12 Tubulo-interstitial nephritis, not specified as acute or chronic: Secondary | ICD-10-CM

## 2017-09-11 DIAGNOSIS — Z79899 Other long term (current) drug therapy: Secondary | ICD-10-CM

## 2017-09-11 DIAGNOSIS — E1010 Type 1 diabetes mellitus with ketoacidosis without coma: Secondary | ICD-10-CM

## 2017-09-11 DIAGNOSIS — R1114 Bilious vomiting: Secondary | ICD-10-CM | POA: Diagnosis not present

## 2017-09-11 DIAGNOSIS — E1140 Type 2 diabetes mellitus with diabetic neuropathy, unspecified: Secondary | ICD-10-CM | POA: Diagnosis not present

## 2017-09-11 DIAGNOSIS — R1013 Epigastric pain: Secondary | ICD-10-CM | POA: Diagnosis not present

## 2017-09-11 DIAGNOSIS — IMO0002 Reserved for concepts with insufficient information to code with codable children: Secondary | ICD-10-CM | POA: Diagnosis present

## 2017-09-11 LAB — COMPREHENSIVE METABOLIC PANEL
ALBUMIN: 4 g/dL (ref 3.5–5.0)
ALK PHOS: 171 U/L — AB (ref 38–126)
ALT: 20 U/L (ref 14–54)
ANION GAP: 21 — AB (ref 5–15)
AST: 17 U/L (ref 15–41)
BUN: 13 mg/dL (ref 6–20)
CALCIUM: 10.2 mg/dL (ref 8.9–10.3)
CHLORIDE: 92 mmol/L — AB (ref 101–111)
CO2: 17 mmol/L — AB (ref 22–32)
Creatinine, Ser: 0.79 mg/dL (ref 0.44–1.00)
GFR calc Af Amer: >60 mL/min (ref >60)
GFR calc non Af Amer: >60 mL/min (ref >60)
GLUCOSE: 458 mg/dL — AB (ref 65–99)
Potassium: 3.8 mmol/L (ref 3.5–5.1)
SODIUM: 130 mmol/L — AB (ref 135–145)
Total Bilirubin: 1.2 mg/dL (ref 0.3–1.2)
Total Protein: 9.3 g/dL — ABNORMAL HIGH (ref 6.5–8.1)

## 2017-09-11 LAB — CBC
HEMATOCRIT: 37.1 % (ref 36.0–46.0)
HEMOGLOBIN: 12.4 g/dL (ref 12.0–15.0)
MCH: 27.7 pg (ref 26.0–34.0)
MCHC: 33.4 g/dL (ref 30.0–36.0)
MCV: 83 fL (ref 78.0–100.0)
Platelets: 615 10*3/uL — ABNORMAL HIGH (ref 150–400)
RBC: 4.47 MIL/uL (ref 3.87–5.11)
RDW: 13.1 % (ref 11.5–15.5)
WBC: 15.8 10*3/uL — ABNORMAL HIGH (ref 4.0–10.5)

## 2017-09-11 LAB — CBG MONITORING, ED
GLUCOSE-CAPILLARY: 351 mg/dL — AB (ref 65–99)
GLUCOSE-CAPILLARY: 247 mg/dL — AB (ref 65–99)
GLUCOSE-CAPILLARY: 269 mg/dL — AB (ref 65–99)
GLUCOSE-CAPILLARY: 235 mg/dL — AB (ref 65–99)
Glucose-Capillary: 463 mg/dL — ABNORMAL HIGH (ref 65–99)
Glucose-Capillary: 383 mg/dL — ABNORMAL HIGH (ref 65–99)
Glucose-Capillary: 347 mg/dL — ABNORMAL HIGH (ref 65–99)

## 2017-09-11 LAB — URINALYSIS, ROUTINE W REFLEX MICROSCOPIC
Bilirubin Urine: NEGATIVE
Glucose, UA: >=500 mg/dL — AB
Hgb urine dipstick: NEGATIVE
Ketones, ur: 80 mg/dL — AB
Nitrite: NEGATIVE
PROTEIN: NEGATIVE mg/dL
SPECIFIC GRAVITY, URINE: 1.021 (ref 1.005–1.030)
pH: 6 (ref 5.0–8.0)

## 2017-09-11 LAB — RAPID URINE DRUG SCREEN, HOSP PERFORMED
Amphetamines: NOT DETECTED
Barbiturates: NOT DETECTED
Benzodiazepines: NOT DETECTED
COCAINE: NOT DETECTED
Opiates: NOT DETECTED
TETRAHYDROCANNABINOL: NOT DETECTED

## 2017-09-11 LAB — LIPASE, BLOOD: Lipase: 22 U/L (ref 11–51)

## 2017-09-11 MED ORDER — ONDANSETRON HCL 4 MG/2ML IJ SOLN
4.0000 mg | Freq: Four times a day (QID) | INTRAMUSCULAR | Status: DC | PRN
  Administered 2017-09-13 (×2): 4 mg via INTRAVENOUS
  Filled 2017-09-11 (×2): qty 2

## 2017-09-11 MED ORDER — DEXTROSE-NACL 5-0.45 % IV SOLN
INTRAVENOUS | Status: DC
  Administered 2017-09-12: via INTRAVENOUS

## 2017-09-11 MED ORDER — SODIUM CHLORIDE 0.9 % IV BOLUS
2000.0000 mL | Freq: Once | INTRAVENOUS | Status: AC
  Administered 2017-09-11: 2000 mL via INTRAVENOUS

## 2017-09-11 MED ORDER — DEXTROSE-NACL 5-0.45 % IV SOLN
INTRAVENOUS | Status: DC
  Administered 2017-09-11: 21:00:00 via INTRAVENOUS

## 2017-09-11 MED ORDER — LORAZEPAM 2 MG/ML IJ SOLN
2.0000 mg | Freq: Once | INTRAMUSCULAR | Status: AC
  Administered 2017-09-11: 2 mg via INTRAVENOUS
  Filled 2017-09-11: qty 1

## 2017-09-11 MED ORDER — HYDRALAZINE HCL 20 MG/ML IJ SOLN: 10.0000 mg | Freq: Four times a day (QID) | INTRAMUSCULAR | Status: DC | PRN

## 2017-09-11 MED ORDER — SODIUM CHLORIDE 0.9 % IV SOLN
INTRAVENOUS | Status: DC
  Administered 2017-09-12: 07:00:00 via INTRAVENOUS

## 2017-09-11 MED ORDER — PANTOPRAZOLE SODIUM 40 MG IV SOLR
40.0000 mg | Freq: Two times a day (BID) | INTRAVENOUS | Status: DC
  Administered 2017-09-12 (×2): 40 mg via INTRAVENOUS
  Filled 2017-09-11 (×2): qty 40

## 2017-09-11 MED ORDER — ENOXAPARIN SODIUM 60 MG/0.6ML ~~LOC~~ SOLN
0.5000 mg/kg | SUBCUTANEOUS | Status: DC
  Administered 2017-09-12: 55 mg via SUBCUTANEOUS
  Filled 2017-09-11: qty 0.6

## 2017-09-11 MED ORDER — SODIUM CHLORIDE 0.9 % IV SOLN
1.0000 g | Freq: Once | INTRAVENOUS | Status: AC
  Administered 2017-09-11: 1 g via INTRAVENOUS
  Filled 2017-09-11: qty 10

## 2017-09-11 MED ORDER — IOPAMIDOL (ISOVUE-300) INJECTION 61%
100.0000 mL | Freq: Once | INTRAVENOUS | Status: AC | PRN
  Administered 2017-09-11: 100 mL via INTRAVENOUS

## 2017-09-11 MED ORDER — DULOXETINE HCL 60 MG PO CPEP
60.0000 mg | ORAL_CAPSULE | Freq: Every day | ORAL | Status: DC
  Administered 2017-09-12 – 2017-09-13 (×2): 60 mg via ORAL
  Filled 2017-09-11 (×2): qty 1

## 2017-09-11 MED ORDER — CLONIDINE HCL 0.1 MG PO TABS
0.1000 mg | ORAL_TABLET | Freq: Two times a day (BID) | ORAL | Status: DC
  Administered 2017-09-12 – 2017-09-13 (×4): 0.1 mg via ORAL
  Filled 2017-09-11 (×4): qty 1

## 2017-09-11 MED ORDER — HALOPERIDOL LACTATE 5 MG/ML IJ SOLN
2.0000 mg | Freq: Once | INTRAMUSCULAR | Status: AC
  Administered 2017-09-11: 2 mg via INTRAVENOUS
  Filled 2017-09-11: qty 1

## 2017-09-11 MED ORDER — SODIUM CHLORIDE 0.9 % IV SOLN
INTRAVENOUS | Status: DC
  Filled 2017-09-11: qty 1

## 2017-09-11 MED ORDER — ONDANSETRON HCL 4 MG/2ML IJ SOLN
4.0000 mg | Freq: Once | INTRAMUSCULAR | Status: AC | PRN
  Administered 2017-09-11: 4 mg via INTRAVENOUS
  Filled 2017-09-11: qty 2

## 2017-09-11 MED ORDER — MORPHINE SULFATE (PF) 2 MG/ML IV SOLN
2.0000 mg | INTRAVENOUS | Status: DC | PRN
  Administered 2017-09-12: 2 mg via INTRAVENOUS
  Filled 2017-09-11: qty 1

## 2017-09-11 MED ORDER — METOCLOPRAMIDE HCL 5 MG/ML IJ SOLN
10.0000 mg | Freq: Four times a day (QID) | INTRAMUSCULAR | Status: DC
  Administered 2017-09-12 – 2017-09-13 (×4): 10 mg via INTRAVENOUS
  Filled 2017-09-11 (×5): qty 2

## 2017-09-11 MED ORDER — MORPHINE SULFATE (PF) 4 MG/ML IV SOLN
4.0000 mg | INTRAVENOUS | Status: AC | PRN
  Administered 2017-09-11 (×2): 4 mg via INTRAVENOUS
  Filled 2017-09-11 (×2): qty 1

## 2017-09-11 MED ORDER — HALOPERIDOL LACTATE 5 MG/ML IJ SOLN
5.0000 mg | Freq: Once | INTRAMUSCULAR | Status: AC
  Administered 2017-09-11: 5 mg via INTRAVENOUS
  Filled 2017-09-11: qty 1

## 2017-09-11 MED ORDER — INSULIN REGULAR HUMAN 100 UNIT/ML IJ SOLN
INTRAMUSCULAR | Status: DC
  Administered 2017-09-11: 3.2 [IU]/h via INTRAVENOUS
  Filled 2017-09-11: qty 1

## 2017-09-11 MED ORDER — POTASSIUM CHLORIDE 10 MEQ/100ML IV SOLN
10.0000 meq | INTRAVENOUS | Status: AC
  Administered 2017-09-12 (×2): 10 meq via INTRAVENOUS
  Filled 2017-09-11 (×2): qty 100

## 2017-09-11 MED ORDER — FENTANYL CITRATE (PF) 100 MCG/2ML IJ SOLN
100.0000 ug | INTRAMUSCULAR | Status: DC | PRN
  Administered 2017-09-11 (×2): 100 ug via INTRAVENOUS
  Filled 2017-09-11 (×2): qty 2

## 2017-09-11 MED ORDER — SODIUM CHLORIDE 0.9 % IV SOLN
1.0000 g | INTRAVENOUS | Status: DC
  Filled 2017-09-11: qty 10

## 2017-09-11 NOTE — ED Notes (Signed)
ED Provider at bedside. 

## 2017-09-11 NOTE — ED Provider Notes (Addendum)
[facility] MEDICAL SURGICAL UNIT Provider Note   CSN: [id number] Arrival date & time: 09/11/17  1401     History   Chief Complaint Chief Complaint  Patient presents with  . Emesis    HPI Deanna Stokes is a 47 y.o. female.  She presents for evaluation of abdominal pain, back pain, nausea, vomiting, weakness and dizziness.  Onset of symptoms yesterday, they are recurrent.  She feels both hot and cold.  She has not measured her temperature at home.  She does not know of any sick contacts.  She states she is taking her usual medications as directed.  No known recent trauma.  There are no other known modifying factors.  HPI  Past Medical History:  Diagnosis Date  . Anxiety   . Depression   . Diabetes mellitus without complication (HCC)   . Hypertension   . Neuropathy     Patient Active Problem List   Diagnosis Date Noted  . Polypharmacy 09/12/2017  . DKA (diabetic ketoacidoses) (HCC) 09/11/2017  . Acute pyelonephritis 09/11/2017  . Epigastric pain   . Diabetic gastroparesis (HCC)   . Intractable vomiting 07/14/2017  . Personal history of noncompliance with medical treatment, presenting hazards to health 03/09/2017  . Mixed hyperlipidemia 02/27/2017  . Uncontrolled type 1 diabetes mellitus with diabetic polyneuropathy (HCC) 02/27/2017  . Type II diabetes mellitus, uncontrolled (HCC) 10/14/2016  . Class 2 severe obesity due to excess calories with serious comorbidity and body mass index (BMI) of 39.0 to 39.9 in adult (HCC) 10/14/2016  . COUGH 01/08/2008  . DEPRESSION 01/07/2008  . Essential hypertension, benign 01/07/2008  . G E R D 01/07/2008    Past Surgical History:  Procedure Laterality Date  . ABDOMINAL HYSTERECTOMY    . CHOLECYSTECTOMY    . TUBAL LIGATION       OB History   None      Home Medications    Prior to Admission medications   Medication Sig Start Date End Date Taking? Authorizing Provider  amLODipine (NORVASC) 5 MG tablet Take 10 mg by  mouth daily.  10/10/14  Yes [provider]  aspirin EC 81 MG tablet Take 81 mg by mouth daily.   Yes [provider]  atorvastatin (LIPITOR) 20 MG tablet Take 20 mg by mouth daily.   Yes [provider]  buprenorphine (SUBUTEX) 8 MG SUBL SL tablet Place 4-8 mg under the tongue 2 (two) times daily. 8MG  IN THE MORNING AND 4MG  IN THE EVENING 07/11/17  Yes [provider]  cloNIDine (CATAPRES) 0.1 MG tablet Take 0.1 mg by mouth 2 (two) times daily.   Yes [provider]  DULoxetine (CYMBALTA) 60 MG capsule Take 60 mg by mouth daily.   Yes [provider]  esomeprazole (NEXIUM) 40 MG capsule Take 1 capsule (40 mg total) by mouth 2 (two) times daily before a meal. 07/16/17  Yes Vassie Loll, MD  estradiol (CLIMARA - DOSED IN MG/24 HR) 0.075 mg/24hr patch Place 0.075 mg onto the skin every 3 (three) days.    Yes [provider]  furosemide (LASIX) 20 MG tablet Take 20 mg by mouth daily.    Yes [provider]  glimepiride (AMARYL) 4 MG tablet Take 4 mg by mouth daily with breakfast.  06/27/17  Yes [provider]  glucagon (GLUCAGON EMERGENCY) 1 MG injection Inject 1 mg into the vein once as needed. 10/26/16  Yes Roma Kayser, MD  hydrOXYzine (ATARAX/VISTARIL) 25 MG tablet Take 25 mg  by mouth 3 (three) times daily as needed for anxiety.   Yes [provider]  Insulin Glargine (LANTUS SOLOSTAR) 100 UNIT/ML Solostar Pen Inject 50 Units into the skin 2 times daily at 12 noon and 4 pm. 07/16/17  Yes Vassie Loll, MD  lisinopril (PRINIVIL,ZESTRIL) 40 MG tablet Take 40 mg by mouth daily.   Yes [provider]  metoCLOPramide (REGLAN) 10 MG tablet Take 1 tablet (10 mg total) by mouth 3 (three) times daily before meals for 60 doses. 07/16/17 09/11/17 Yes Vassie Loll, MD  omega-3 acid ethyl esters (LOVAZA) 1 G capsule Take 1 g by mouth 2 (two) times daily. [facility] 10/16/14  Yes [provider]    promethazine (PHENERGAN) 6.25 MG/5ML syrup Take 25 mg by mouth every 6 (six) hours as needed for nausea or vomiting.   Yes [provider]  traZODone (DESYREL) 50 MG tablet Take 50 mg by mouth at bedtime as needed for sleep.   Yes [provider]  Vitamin D, Ergocalciferol, (DRISDOL) 50000 UNITS CAPS capsule Take 50,000 Units by mouth 2 (two) times a week. Sunday and Thursday   Yes [provider]  Continuous Blood Gluc Sensor (FREESTYLE LIBRE SENSOR SYSTEM) MISC Use one sensor every 10 days. 03/09/17   Roma Kayser, MD  ondansetron (ZOFRAN ODT) 8 MG disintegrating tablet Take 1 tablet (8 mg total) by mouth every 8 (eight) hours as needed for nausea or vomiting. Patient not taking: Reported on 09/11/2017 07/16/17   Vassie Loll, MD    Family History History reviewed. No pertinent family history.  Social History Social History   Tobacco Use  . Smoking status: Never Smoker  . Smokeless tobacco: Never Used  Substance Use Topics  . Alcohol use: Yes  . Drug use: No     Allergies   Ibuprofen; Ciprofloxacin; Metformin and related; and Hydrocodone-acetaminophen   Review of Systems Review of Systems  All other systems reviewed and are negative.    Physical Exam Updated Vital Signs BP (!) 151/95 (BP Location: Right Arm)   Pulse 98   Temp 98.2 F (36.8 C) (Oral)   Resp (!) 24   Ht 5\' 7"  (1.702 m)   Wt 105.5 kg (232 lb 9.4 oz)   SpO2 100%   BMI 36.43 kg/m   Physical Exam  Constitutional: She is oriented to person, place, and time. She appears well-developed. She appears distressed (Sitting on a chair, retching, with trash can between her legs.  She is groaning continually.).  Obese  HENT:  Head: Normocephalic and atraumatic.  Eyes: Pupils are equal, round, and reactive to light. Conjunctivae and EOM are normal.  Neck: Normal range of motion and phonation normal. Neck supple.  Cardiovascular: Normal rate.  Pulmonary/Chest: Effort normal.   Musculoskeletal: Normal range of motion.  Neurological: She is alert and oriented to person, place, and time. She exhibits normal muscle tone.  Skin: Skin is warm and dry.  Psychiatric: Her behavior is normal. Judgment and thought content normal.  Anxious  Nursing note and vitals reviewed.    ED Treatments / Results  Labs (all labs ordered are listed, but only abnormal results are displayed) Labs Reviewed  URINE CULTURE - Abnormal; Notable for the following components:      Result Value   Culture MULTIPLE SPECIES PRESENT, SUGGEST RECOLLECTION (*)    All other components within normal limits  COMPREHENSIVE METABOLIC PANEL - Abnormal; Notable for the following components:   Sodium 130 (*)    Chloride 92 (*)  CO2 17 (*)    Glucose, Bld 458 (*)    Total Protein 9.3 (*)    Alkaline Phosphatase 171 (*)    Anion gap 21 (*)    All other components within normal limits  CBC - Abnormal; Notable for the following components:   WBC 15.8 (*)    Platelets 615 (*)    All other components within normal limits  URINALYSIS, ROUTINE W REFLEX MICROSCOPIC - Abnormal; Notable for the following components:   APPearance HAZY (*)    Glucose, UA >=500 (*)    Ketones, ur 80 (*)    Leukocytes, UA LARGE (*)    Bacteria, UA RARE (*)    Squamous Epithelial / LPF 6-30 (*)    All other components within normal limits  BASIC METABOLIC PANEL - Abnormal; Notable for the following components:   Sodium 132 (*)    Potassium 3.3 (*)    Chloride 98 (*)    Glucose, Bld 237 (*)    Calcium 8.8 (*)    All other components within normal limits  BASIC METABOLIC PANEL - Abnormal; Notable for the following components:   Sodium 133 (*)    Potassium 3.1 (*)    Chloride 100 (*)    Glucose, Bld 191 (*)    Calcium 8.6 (*)    All other components within normal limits  BASIC METABOLIC PANEL - Abnormal; Notable for the following components:   Sodium 134 (*)    Chloride 99 (*)    CO2 21 (*)    Glucose, Bld 277 (*)     Calcium 8.8 (*)    All other components within normal limits  BASIC METABOLIC PANEL - Abnormal; Notable for the following components:   Sodium 133 (*)    Chloride 99 (*)    CO2 21 (*)    Glucose, Bld 326 (*)    All other components within normal limits  GLUCOSE, CAPILLARY - Abnormal; Notable for the following components:   Glucose-Capillary 229 (*)    All other components within normal limits  GLUCOSE, CAPILLARY - Abnormal; Notable for the following components:   Glucose-Capillary 224 (*)    All other components within normal limits  GLUCOSE, CAPILLARY - Abnormal; Notable for the following components:   Glucose-Capillary 205 (*)    All other components within normal limits  GLUCOSE, CAPILLARY - Abnormal; Notable for the following components:   Glucose-Capillary 194 (*)    All other components within normal limits  GLUCOSE, CAPILLARY - Abnormal; Notable for the following components:   Glucose-Capillary 196 (*)    All other components within normal limits  GLUCOSE, CAPILLARY - Abnormal; Notable for the following components:   Glucose-Capillary 139 (*)    All other components within normal limits  GLUCOSE, CAPILLARY - Abnormal; Notable for the following components:   Glucose-Capillary 130 (*)    All other components within normal limits  GLUCOSE, CAPILLARY - Abnormal; Notable for the following components:   Glucose-Capillary 206 (*)    All other components within normal limits  GLUCOSE, CAPILLARY - Abnormal; Notable for the following components:   Glucose-Capillary 340 (*)    All other components within normal limits  GLUCOSE, CAPILLARY - Abnormal; Notable for the following components:   Glucose-Capillary 196 (*)    All other components within normal limits  GLUCOSE, CAPILLARY - Abnormal; Notable for the following components:   Glucose-Capillary 278 (*)    All other components within normal limits  GLUCOSE, CAPILLARY - Abnormal; Notable for the  following components:    Glucose-Capillary 331 (*)    All other components within normal limits  GLUCOSE, CAPILLARY - Abnormal; Notable for the following components:   Glucose-Capillary 299 (*)    All other components within normal limits  CBG MONITORING, ED - Abnormal; Notable for the following components:   Glucose-Capillary 463 (*)    All other components within normal limits  CBG MONITORING, ED - Abnormal; Notable for the following components:   Glucose-Capillary 383 (*)    All other components within normal limits  CBG MONITORING, ED - Abnormal; Notable for the following components:   Glucose-Capillary 347 (*)    All other components within normal limits  CBG MONITORING, ED - Abnormal; Notable for the following components:   Glucose-Capillary 351 (*)    All other components within normal limits  CBG MONITORING, ED - Abnormal; Notable for the following components:   Glucose-Capillary 247 (*)    All other components within normal limits  CBG MONITORING, ED - Abnormal; Notable for the following components:   Glucose-Capillary 269 (*)    All other components within normal limits  CBG MONITORING, ED - Abnormal; Notable for the following components:   Glucose-Capillary 235 (*)    All other components within normal limits  MRSA PCR SCREENING  LIPASE, BLOOD  RAPID URINE DRUG SCREEN, HOSP PERFORMED    EKG None  Radiology Ct Abdomen Pelvis W Contrast  Result Date: 09/11/2017 CLINICAL DATA:  Nausea and vomiting. EXAM: CT ABDOMEN AND PELVIS WITH CONTRAST TECHNIQUE: Multidetector CT imaging of the abdomen and pelvis was performed using the standard protocol following bolus administration of intravenous contrast. CONTRAST:  ISOVUE-300 IOPAMIDOL (ISOVUE-300) INJECTION 61% COMPARISON:  CT scan of July 14, 2017. FINDINGS: Lower chest: No acute abnormality. Hepatobiliary: No focal liver abnormality is seen. Status post cholecystectomy. No biliary dilatation. Pancreas: Unremarkable. No pancreatic ductal  dilatation or surrounding inflammatory changes. Spleen: Normal in size without focal abnormality. Adrenals/Urinary Tract: Adrenal glands appear normal. Wedge-shaped low density is seen in midpole of left kidney as well as in anterior portion of right kidney. This is concerning for bilateral lobar nephronia or pyelonephritis. No hydronephrosis or renal obstruction is noted. Urinary bladder is unremarkable. Stomach/Bowel: Stomach is within normal limits. Appendix appears normal. No evidence of bowel wall thickening, distention, or inflammatory changes. Vascular/Lymphatic: No significant vascular findings are present. No enlarged abdominal or pelvic lymph nodes. Reproductive: Status post hysterectomy. No adnexal masses. Other: No abdominal wall hernia or abnormality. No abdominopelvic ascites. Musculoskeletal: No acute or significant osseous findings. IMPRESSION: Wedge-shaped low density is noted in midpole of left kidney as well as involving anterior portion of right kidney. This is concerning for possible bilateral lobar nephronia or pyelonephritis. Clinical correlation is recommended. Electronically Signed   By: Lupita Raider, M.D.   On: 09/11/2017 21:02    Procedures .Critical Care Performed by: Mancel Bale, MD Authorized by: Mancel Bale, MD   Critical care provider statement:    Critical care time (minutes):  45   Critical care start time:  09/11/2017 2:54 PM   Critical care end time:  09/11/2017 9:10 PM   Critical care time was exclusive of:  Separately billable procedures and treating other patients   Critical care was necessary to treat or prevent imminent or life-threatening deterioration of the following conditions:  Endocrine crisis   Critical care was time spent personally by me on the following activities:  Blood draw for specimens, development of treatment plan with patient or surrogate, discussions  with consultants, evaluation of patient's response to treatment, examination of patient,  obtaining history from patient or surrogate, ordering and performing treatments and interventions, ordering and review of laboratory studies, pulse oximetry, re-evaluation of patient's condition, review of old charts and ordering and review of radiographic studies   (including critical care time)  Medications Ordered in ED Medications  ondansetron (ZOFRAN) injection 4 mg (4 mg Intravenous Given 09/11/17 1511)  sodium chloride 0.9 % bolus 2,000 mL (0 mLs Intravenous Stopped 09/11/17 1830)  haloperidol lactate (HALDOL) injection 2 mg (2 mg Intravenous Given 09/11/17 1622)  morphine 4 MG/ML injection 4 mg (4 mg Intravenous Given 09/11/17 1816)  haloperidol lactate (HALDOL) injection 5 mg (5 mg Intravenous Given 09/11/17 1826)  LORazepam (ATIVAN) injection 2 mg (2 mg Intravenous Given 09/11/17 1826)  iopamidol (ISOVUE-300) 61 % injection 100 mL (100 mLs Intravenous Contrast Given 09/11/17 2026)  cefTRIAXone (ROCEPHIN) 1 g in sodium chloride 0.9 % 100 mL IVPB (0 g Intravenous Stopped 09/11/17 2259)  potassium chloride 10 mEq in 100 mL IVPB (0 mEq Intravenous Stopped 09/12/17 0330)  insulin glargine (LANTUS) injection 10 Units (10 Units Subcutaneous Given 09/12/17 0628)  insulin glargine (LANTUS) injection 30 Units (30 Units Subcutaneous Given 09/12/17 1033)  promethazine (PHENERGAN) injection 12.5 mg (12.5 mg Intravenous Given 09/13/17 0352)     Initial Impression / Assessment and Plan / ED Course  I have reviewed the triage vital signs and the nursing notes.  Pertinent labs & imaging results that were available during my care of the patient were reviewed by me and considered in my medical decision making (see chart for details).  Clinical Course as of Sep 27 846  Mon Sep 11, 2017  1814 Patient is tearful, upset, asking for "something to knock me out."  She received a dose of morphine about 10 minutes ago and remains uncomfortable.  Additional medication ordered.   [EW]  1815 Normal except elevated white blood  cell count  CBC(!) [EW]  1815 Normal, no drug of abuse  Urine rapid drug screen (hosp performed) [EW]  1815 Normal except sodium low 130, chloride 92, CO2 17, glucose high at 458, and anion gap elevated 21  Comprehensive metabolic panel(!) [EW]  1815 Normal  Lipase, blood [EW]  1816 Normal except elevated ketones, leukocytes and bacteria.  Also, squamous cells present raising concern for contaminated sample.  Urinalysis, Routine w reflex microscopic(!) [EW]  2107 Possible left-sided pyelonephritis, no other intra-abdominal abnormality.  CT Abdomen Pelvis W Contrast [EW]  2147 The patient's daughter-in-law asked that I take her phone number down to be contacted if needed.  Her daughter-in-law's name is Toniann Fail.  Her phone number is [phone].   [EW]    Clinical Course User Index [EW] Mancel Bale, MD     No data found.  9:09 PM Reevaluation with update and discussion. After initial assessment and treatment, an updated evaluation reveals she is more comfortable now resting supine.  Findings discussed and questions answered;  family members here at this time.Mancel Bale   MDM-abdominal pain with vomiting, and DKA.  Possible urinary tract infection.  Empiric antibiotic begun, urine culture pending.  Doubt severe sepsis, or impending vascular collapse.  Patient will require admission for further treatment and evaluation.  Nursing Notes Reviewed/ Care Coordinated Applicable Imaging Reviewed Interpretation of Laboratory Data incorporated into ED treatment   9:11 PM-Consult complete with hospitalist. Patient case explained and discussed. He agrees to admit patient for further evaluation and treatment. Call ended at 2126  Plan:  Admit     Final Clinical Impressions(s) / ED Diagnoses   Final diagnoses:  Diabetic ketoacidosis without coma associated with type 1 diabetes mellitus (HCC)  Nausea and vomiting, intractability of vomiting not specified, unspecified vomiting type    Pyelonephritis    ED Discharge Orders        Ordered    cefdinir (OMNICEF) 300 MG capsule  Every 12 hours     09/13/17 1301    Increase activity slowly     09/13/17 1301    Diet - low sodium heart healthy     09/13/17 1301       Mancel Bale, MD 09/11/17 2126    Mancel Bale, MD 09/11/17 1610    Mancel Bale, MD 09/26/17 [id number]

## 2017-09-11 NOTE — H&P (Signed)
TRH H&P    Patient Demographics:    Deanna Stokes, is a 47 y.o. female  MRN: [id number]  DOB - 07/06/1970  Admit Date - 09/11/2017  Referring MD/NP/PA: Dr. Effie ShyWentz  Outpatient Primary MD for the patient is Toma DeitersHasanaj, Xaje A, MD  Patient coming from: Home  Chief complaint-vomiting   HPI:    Deanna FasterJosie Stokes  is a 47 y.o. female, with history of diabetes mellitus, diabetic gastroparesis, depression, hypertension, GERD came to hospital with complaints of nausea, vomiting, diarrhea for past 2 days.  Patient says that she had 4-5 episodes of loose bowel movements yesterday and 4-5 episodes of vomiting.  She denies any abdominal pain.  Complains of dysuria.  Patient did not take her Lantus as prescribed as she did not eat anything today. In the ED lab work showed DKA with anion gap of 21 CT of the abdomen pelvis showed wedge shaped low-density in the midpole of left kidney as well as involving the anterior portion of right kidney concerning for bilateral pyelonephritis. Patient started on IV ceftriaxone. She denies chest pain Denies shortness of breath.  No previous history of stroke or seizures.  Patient takes Suboxone at home    Review of systems:    In addition to the HPI above,    All other systems reviewed and are negative.   With Past History of the following :    Past Medical History:  Diagnosis Date  . Anxiety   . Depression   . Diabetes mellitus without complication (HCC)   . Hypertension   . Neuropathy       Past Surgical History:  Procedure Laterality Date  . ABDOMINAL HYSTERECTOMY    . CHOLECYSTECTOMY    . TUBAL LIGATION        Social History:      Social History   Tobacco Use  . Smoking status: Never Smoker  . Smokeless tobacco: Never Used  Substance Use Topics  . Alcohol use: Yes       Family History :   Family history of cancer and heart disease in patient's father's  side of family   Home Medications:   Prior to Admission medications   Medication Sig Start Date End Date Taking? Authorizing Provider  amLODipine (NORVASC) 5 MG tablet Take 10 mg by mouth daily.  10/10/14  Yes [provider]  aspirin EC 81 MG tablet Take 81 mg by mouth daily.   Yes [provider]  atorvastatin (LIPITOR) 20 MG tablet Take 20 mg by mouth daily.   Yes [provider]  buprenorphine (SUBUTEX) 8 MG SUBL SL tablet Place 4-8 mg under the tongue 2 (two) times daily. 8MG  IN THE MORNING AND 4MG  IN THE EVENING 07/11/17  Yes [provider]  cloNIDine (CATAPRES) 0.1 MG tablet Take 0.1 mg by mouth 2 (two) times daily.   Yes [provider]  DULoxetine (CYMBALTA) 60 MG capsule Take 60 mg by mouth daily.   Yes [provider]  esomeprazole (NEXIUM) 40 MG capsule Take 1 capsule (40 mg total) by  mouth 2 (two) times daily before a meal. 07/16/17  Yes Vassie Loll, MD  estradiol (CLIMARA - DOSED IN MG/24 HR) 0.075 mg/24hr patch Place 0.075 mg onto the skin every 3 (three) days.    Yes [provider]  furosemide (LASIX) 20 MG tablet Take 20 mg by mouth daily.    Yes [provider]  gabapentin (NEURONTIN) 600 MG tablet Take 600 mg by mouth 3 (three) times daily.   Yes [provider]  glimepiride (AMARYL) 4 MG tablet Take 4 mg by mouth daily with breakfast.  06/27/17  Yes [provider]  glucagon (GLUCAGON EMERGENCY) 1 MG injection Inject 1 mg into the vein once as needed. 10/26/16  Yes Roma Kayser, MD  hydrOXYzine (ATARAX/VISTARIL) 25 MG tablet Take 25 mg by mouth 3 (three) times daily as needed for anxiety.   Yes [provider]  Insulin Glargine (LANTUS SOLOSTAR) 100 UNIT/ML Solostar Pen Inject 50 Units into the skin 2 times daily at 12 noon and 4 pm. 07/16/17  Yes Vassie Loll, MD  lisinopril (PRINIVIL,ZESTRIL) 40 MG tablet Take 40 mg by mouth daily.   Yes [provider]    methocarbamol (ROBAXIN) 500 MG tablet Take 500 mg by mouth every 6 (six) hours as needed for muscle spasms.   Yes [provider]  metoCLOPramide (REGLAN) 10 MG tablet Take 1 tablet (10 mg total) by mouth 3 (three) times daily before meals for 60 doses. 07/16/17 09/11/17 Yes Vassie Loll, MD  omega-3 acid ethyl esters (LOVAZA) 1 G capsule Take 1 g by mouth 2 (two) times daily. [facility] 10/16/14  Yes [provider]  promethazine (PHENERGAN) 6.25 MG/5ML syrup Take 25 mg by mouth every 6 (six) hours as needed for nausea or vomiting.   Yes [provider]  traZODone (DESYREL) 50 MG tablet Take 50 mg by mouth at bedtime as needed for sleep.   Yes [provider]  Vitamin D, Ergocalciferol, (DRISDOL) 50000 UNITS CAPS capsule Take 50,000 Units by mouth 2 (two) times a week. Sunday and Thursday   Yes [provider]  zolpidem (AMBIEN) 10 MG tablet Take 10 mg by mouth at bedtime.  06/23/17  Yes [provider]  Continuous Blood Gluc Sensor (FREESTYLE LIBRE SENSOR SYSTEM) MISC Use one sensor every 10 days. 03/09/17   Roma Kayser, MD  ondansetron (ZOFRAN ODT) 8 MG disintegrating tablet Take 1 tablet (8 mg total) by mouth every 8 (eight) hours as needed for nausea or vomiting. Patient not taking: Reported on 09/11/2017 07/16/17   Vassie Loll, MD     Allergies:     Allergies  Allergen Reactions  . Ibuprofen Shortness Of Breath and Rash  . Ciprofloxacin Hives  . Metformin And Related Other (See Comments)    MD told patient not to take  . Hydrocodone-Acetaminophen Rash     Physical Exam:   Vitals  Blood pressure 131/77, pulse (!) 116, temperature 98.7 F (37.1 C), temperature source Oral, resp. rate 18, height 5\' 7"  (1.702 m), weight 108.9 kg (240 lb), SpO2 97 %.  1.  General: Appears in no acute distress  2. Psychiatric:  Intact judgement and  insight, awake alert, oriented x 3.  3. Neurologic: No focal neurological deficits, all  cranial nerves intact.Strength 5/5 all 4 extremities, sensation intact all 4 extremities, plantars down going.  4. Eyes :  anicteric sclerae, moist conjunctivae with no lid lag. PERRLA.  5. ENMT:  Oropharynx clear with moist mucous membranes and good dentition  6. Neck:  supple, no cervical lymphadenopathy appriciated, No thyromegaly  7. Respiratory : Normal respiratory effort, good air movement bilaterally,clear to  auscultation bilaterally  8. Cardiovascular : RRR, no gallops, rubs or murmurs, no leg edema  9. Gastrointestinal:  Positive bowel sounds, abdomen soft, non-tender to palpation,no hepatosplenomegaly, no rigidity or guarding       10. Skin:  No cyanosis, normal texture and turgor, no rash, lesions or ulcers  11.Musculoskeletal:  Good muscle tone,  joints appear normal , no effusions,  normal range of motion    Data Review:    CBC Recent Labs  Lab 09/11/17 1449  WBC 15.8*  HGB 12.4  HCT 37.1  PLT 615*  MCV 83.0  MCH 27.7  MCHC 33.4  RDW 13.1   ------------------------------------------------------------------------------------------------------------------  Chemistries  Recent Labs  Lab 09/11/17 1449  NA 130*  K 3.8  CL 92*  CO2 17*  GLUCOSE 458*  BUN 13  CREATININE 0.79  CALCIUM 10.2  AST 17  ALT 20  ALKPHOS 171*  BILITOT 1.2   ------------------------------------------------------------------------------------------------------------------  ------------------------------------------------------------------------------------------------------------------ GFR: Estimated Creatinine Clearance: 112.9 mL/min (by C-G formula based on SCr of 0.79 mg/dL). Liver Function Tests: Recent Labs  Lab 09/11/17 1449  AST 17  ALT 20  ALKPHOS 171*  BILITOT 1.2  PROT 9.3*  ALBUMIN 4.0   Recent Labs  Lab 09/11/17 1449  LIPASE 22   CBG: Recent Labs  Lab 09/11/17 1657 09/11/17 1820 09/11/17 1923 09/11/17 2055 09/11/17 2201  GLUCAP 383*  347* 351* 247* 269*     --------------------------------------------------------------------------------------------------------------- Urine analysis:    Component Value Date/Time   COLORURINE YELLOW 09/11/2017 1449   APPEARANCEUR HAZY (A) 09/11/2017 1449   LABSPEC 1.021 09/11/2017 1449   PHURINE 6.0 09/11/2017 1449   GLUCOSEU >=500 (A) 09/11/2017 1449   HGBUR NEGATIVE 09/11/2017 1449   BILIRUBINUR NEGATIVE 09/11/2017 1449   KETONESUR 80 (A) 09/11/2017 1449   PROTEINUR NEGATIVE 09/11/2017 1449   NITRITE NEGATIVE 09/11/2017 1449   LEUKOCYTESUR LARGE (A) 09/11/2017 1449      Imaging Results:    Ct Abdomen Pelvis W Contrast  Result Date: 09/11/2017 CLINICAL DATA:  Nausea and vomiting. EXAM: CT ABDOMEN AND PELVIS WITH CONTRAST TECHNIQUE: Multidetector CT imaging of the abdomen and pelvis was performed using the standard protocol following bolus administration of intravenous contrast. CONTRAST:  ISOVUE-300 IOPAMIDOL (ISOVUE-300) INJECTION 61% COMPARISON:  CT scan of July 14, 2017. FINDINGS: Lower chest: No acute abnormality. Hepatobiliary: No focal liver abnormality is seen. Status post cholecystectomy. No biliary dilatation. Pancreas: Unremarkable. No pancreatic ductal dilatation or surrounding inflammatory changes. Spleen: Normal in size without focal abnormality. Adrenals/Urinary Tract: Adrenal glands appear normal. Wedge-shaped low density is seen in midpole of left kidney as well as in anterior portion of right kidney. This is concerning for bilateral lobar nephronia or pyelonephritis. No hydronephrosis or renal obstruction is noted. Urinary bladder is unremarkable. Stomach/Bowel: Stomach is within normal limits. Appendix appears normal. No evidence of bowel wall thickening, distention, or inflammatory changes. Vascular/Lymphatic: No significant vascular findings are present. No enlarged abdominal or pelvic lymph nodes. Reproductive: Status post hysterectomy. No adnexal masses.  Other: No abdominal wall hernia or abnormality. No abdominopelvic ascites. Musculoskeletal: No acute or significant osseous findings. IMPRESSION: Wedge-shaped low density is noted in midpole of left kidney as well as involving anterior portion of right kidney. This is concerning for possible bilateral lobar nephronia or pyelonephritis. Clinical correlation is recommended. Electronically Signed   By: Lupita Raider, M.D.  On: 09/11/2017 21:02      Assessment & Plan:    Active Problems:   Essential hypertension, benign   Type II diabetes mellitus, uncontrolled (HCC)   DKA (diabetic ketoacidoses) (HCC)   Acute pyelonephritis   1. Diabetic ketoacidosis-admit to stepdown, initiate DKA protocol.  Check BMP every 4 hours.  Currently patient on IV insulin.  Will switch to Lantus and sliding scale insulin once blood glucose less than 250. 2. Bilateral pyelonephritis-seen on the CT scan patient also has dysuria with abnormal UA.  Start IV ceftriaxone per pharmacy consultation.  Follow urine culture results. 3. Diabetic gastroparesis-continue Zofran as needed for nausea and vomiting, start Reglan 10 mg IV every 6 hours. 4. Hypertension-continue Catapres 0.1 mg p.o. twice daily.  Also start IV hydralazine as needed for BP more than 160/100.    DVT Prophylaxis-   Lovenox   AM Labs Ordered, also please review Full Orders  Family Communication: Admission, patients condition and plan of care including tests being ordered have been discussed with the patient  who indicate understanding and agree with the plan and Code Status.  Code Status: Full code  Admission status: Inpatient  Time spent in minutes : 60 minutes   Meredeth Ide M.D on 09/11/2017 at 10:04 PM  Between 7am to 7pm - Pager - [phone]. After 7pm go to [email or web address] - password [facility]  [facility] - Office  [phone]

## 2017-09-11 NOTE — ED Triage Notes (Signed)
Pt complaining of n/v for a few days. Pt states is diabetic and gets sick a lot for same. Pt hyperventilating in triage. No active vomiting noted.

## 2017-09-11 NOTE — ED Notes (Addendum)
Pt up to bedside toilet and back to bed without staff assistance, tolerated well.

## 2017-09-11 NOTE — ED Notes (Signed)
Patient transported to CT 

## 2017-09-11 NOTE — Progress Notes (Signed)
Pharmacy Antibiotic Note  Carroll KindsJosie G Naugle is a 47 y.o. female admitted on 09/11/2017 with UTI.  Pharmacy has been consulted for ceftriaxone dosing.  Plan: Ceftriaxone 1000 mg IV q24 hours  Monitor labs, c/s/ and patient improvement  Height: 5\' 7"  (170.2 cm) Weight: 240 lb (108.9 kg) IBW/kg (Calculated) : 61.6  Temp (24hrs), Avg:98.7 F (37.1 C), Min:98.7 F (37.1 C), Max:98.7 F (37.1 C)  Recent Labs  Lab 09/11/17 1449  WBC 15.8*  CREATININE 0.79    Estimated Creatinine Clearance: 112.9 mL/min (by C-G formula based on SCr of 0.79 mg/dL).    Allergies  Allergen Reactions  . Ibuprofen Shortness Of Breath and Rash  . Ciprofloxacin Hives  . Metformin And Related Other (See Comments)    MD told patient not to take  . Hydrocodone-Acetaminophen Rash    Antimicrobials this admission: Ceftriaxone 4/8 >>    Dose adjustments this admission: N/A  Microbiology results: 4/8 UCx: pending    Thank you for allowing pharmacy to be a part of this patient's care.  Tad MooreSteven C Jaylynne Birkhead 09/11/2017 10:03 PM

## 2017-09-12 ENCOUNTER — Other Ambulatory Visit: Payer: Self-pay

## 2017-09-12 DIAGNOSIS — Z79899 Other long term (current) drug therapy: Secondary | ICD-10-CM

## 2017-09-12 DIAGNOSIS — K219 Gastro-esophageal reflux disease without esophagitis: Secondary | ICD-10-CM

## 2017-09-12 DIAGNOSIS — E1143 Type 2 diabetes mellitus with diabetic autonomic (poly)neuropathy: Secondary | ICD-10-CM | POA: Diagnosis not present

## 2017-09-12 DIAGNOSIS — N10 Acute tubulo-interstitial nephritis: Secondary | ICD-10-CM | POA: Diagnosis not present

## 2017-09-12 DIAGNOSIS — N12 Tubulo-interstitial nephritis, not specified as acute or chronic: Secondary | ICD-10-CM | POA: Diagnosis not present

## 2017-09-12 DIAGNOSIS — E1010 Type 1 diabetes mellitus with ketoacidosis without coma: Secondary | ICD-10-CM

## 2017-09-12 DIAGNOSIS — K3184 Gastroparesis: Secondary | ICD-10-CM

## 2017-09-12 DIAGNOSIS — R112 Nausea with vomiting, unspecified: Secondary | ICD-10-CM | POA: Diagnosis not present

## 2017-09-12 DIAGNOSIS — I10 Essential (primary) hypertension: Secondary | ICD-10-CM

## 2017-09-12 DIAGNOSIS — E1165 Type 2 diabetes mellitus with hyperglycemia: Secondary | ICD-10-CM

## 2017-09-12 DIAGNOSIS — Z6839 Body mass index (BMI) 39.0-39.9, adult: Secondary | ICD-10-CM

## 2017-09-12 DIAGNOSIS — E1110 Type 2 diabetes mellitus with ketoacidosis without coma: Secondary | ICD-10-CM | POA: Diagnosis not present

## 2017-09-12 LAB — GLUCOSE, CAPILLARY
GLUCOSE-CAPILLARY: 205 mg/dL — AB (ref 65–99)
GLUCOSE-CAPILLARY: 194 mg/dL — AB (ref 65–99)
GLUCOSE-CAPILLARY: 196 mg/dL — AB (ref 65–99)
GLUCOSE-CAPILLARY: 278 mg/dL — AB (ref 65–99)
Glucose-Capillary: 130 mg/dL — ABNORMAL HIGH (ref 65–99)
Glucose-Capillary: 139 mg/dL — ABNORMAL HIGH (ref 65–99)
Glucose-Capillary: 196 mg/dL — ABNORMAL HIGH (ref 65–99)
Glucose-Capillary: 206 mg/dL — ABNORMAL HIGH (ref 65–99)
Glucose-Capillary: 224 mg/dL — ABNORMAL HIGH (ref 65–99)
Glucose-Capillary: 229 mg/dL — ABNORMAL HIGH (ref 65–99)
Glucose-Capillary: 340 mg/dL — ABNORMAL HIGH (ref 65–99)

## 2017-09-12 LAB — BASIC METABOLIC PANEL
Anion gap: 10 (ref 5–15)
Anion gap: 11 (ref 5–15)
Anion gap: 13 (ref 5–15)
Anion gap: 14 (ref 5–15)
BUN: 6 mg/dL (ref 6–20)
BUN: 6 mg/dL (ref 6–20)
BUN: 6 mg/dL (ref 6–20)
BUN: 8 mg/dL (ref 6–20)
CALCIUM: 8.9 mg/dL (ref 8.9–10.3)
CO2: 21 mmol/L — ABNORMAL LOW (ref 22–32)
CO2: 21 mmol/L — ABNORMAL LOW (ref 22–32)
CO2: 23 mmol/L (ref 22–32)
CO2: 23 mmol/L (ref 22–32)
CREATININE: 0.63 mg/dL (ref 0.44–1.00)
CREATININE: 0.58 mg/dL (ref 0.44–1.00)
Calcium: 8.6 mg/dL — ABNORMAL LOW (ref 8.9–10.3)
Calcium: 8.8 mg/dL — ABNORMAL LOW (ref 8.9–10.3)
Calcium: 8.8 mg/dL — ABNORMAL LOW (ref 8.9–10.3)
Chloride: 100 mmol/L — ABNORMAL LOW (ref 101–111)
Chloride: 98 mmol/L — ABNORMAL LOW (ref 101–111)
Chloride: 99 mmol/L — ABNORMAL LOW (ref 101–111)
Chloride: 99 mmol/L — ABNORMAL LOW (ref 101–111)
Creatinine, Ser: 0.57 mg/dL (ref 0.44–1.00)
Creatinine, Ser: 0.57 mg/dL (ref 0.44–1.00)
GFR calc Af Amer: >60 mL/min (ref >60)
GFR calc Af Amer: >60 mL/min (ref >60)
GFR calc Af Amer: >60 mL/min (ref >60)
GFR calc non Af Amer: >60 mL/min (ref >60)
Glucose, Bld: 191 mg/dL — ABNORMAL HIGH (ref 65–99)
Glucose, Bld: 237 mg/dL — ABNORMAL HIGH (ref 65–99)
Glucose, Bld: 277 mg/dL — ABNORMAL HIGH (ref 65–99)
Glucose, Bld: 326 mg/dL — ABNORMAL HIGH (ref 65–99)
POTASSIUM: 3.1 mmol/L — AB (ref 3.5–5.1)
POTASSIUM: 3.3 mmol/L — AB (ref 3.5–5.1)
POTASSIUM: 3.5 mmol/L (ref 3.5–5.1)
Potassium: 4 mmol/L (ref 3.5–5.1)
SODIUM: 132 mmol/L — AB (ref 135–145)
SODIUM: 133 mmol/L — AB (ref 135–145)
SODIUM: 133 mmol/L — AB (ref 135–145)
SODIUM: 134 mmol/L — AB (ref 135–145)

## 2017-09-12 LAB — MRSA PCR SCREENING: MRSA BY PCR: NEGATIVE

## 2017-09-12 MED ORDER — BUPRENORPHINE HCL-NALOXONE HCL 8-2 MG SL SUBL
1.0000 | SUBLINGUAL_TABLET | Freq: Every day | SUBLINGUAL | Status: DC
  Administered 2017-09-12 – 2017-09-13 (×2): 1 via SUBLINGUAL
  Filled 2017-09-12 (×2): qty 1

## 2017-09-12 MED ORDER — INSULIN GLARGINE 100 UNIT/ML ~~LOC~~ SOLN
50.0000 [IU] | Freq: Every day | SUBCUTANEOUS | Status: DC
  Filled 2017-09-12: qty 0.5

## 2017-09-12 MED ORDER — INSULIN GLARGINE 100 UNIT/ML ~~LOC~~ SOLN
30.0000 [IU] | Freq: Once | SUBCUTANEOUS | Status: AC
  Administered 2017-09-12: 30 [IU] via SUBCUTANEOUS
  Filled 2017-09-12: qty 0.3

## 2017-09-12 MED ORDER — LISINOPRIL 10 MG PO TABS
40.0000 mg | ORAL_TABLET | Freq: Every day | ORAL | Status: DC
  Administered 2017-09-12 – 2017-09-13 (×2): 40 mg via ORAL
  Filled 2017-09-12 (×2): qty 4

## 2017-09-12 MED ORDER — HYDROXYZINE HCL 25 MG PO TABS
25.0000 mg | ORAL_TABLET | Freq: Three times a day (TID) | ORAL | Status: DC | PRN
  Administered 2017-09-12 – 2017-09-13 (×3): 25 mg via ORAL
  Filled 2017-09-12 (×3): qty 1

## 2017-09-12 MED ORDER — PANTOPRAZOLE SODIUM 40 MG PO TBEC
40.0000 mg | DELAYED_RELEASE_TABLET | Freq: Two times a day (BID) | ORAL | Status: DC
  Administered 2017-09-12 – 2017-09-13 (×2): 40 mg via ORAL
  Filled 2017-09-12 (×2): qty 1

## 2017-09-12 MED ORDER — INSULIN GLARGINE 100 UNIT/ML ~~LOC~~ SOLN: 10.0000 [IU] | Freq: Every day | SUBCUTANEOUS | Status: DC

## 2017-09-12 MED ORDER — ACETAMINOPHEN 325 MG PO TABS
650.0000 mg | ORAL_TABLET | ORAL | Status: DC | PRN
  Administered 2017-09-12 – 2017-09-13 (×2): 650 mg via ORAL
  Filled 2017-09-12 (×2): qty 2

## 2017-09-12 MED ORDER — CEFDINIR 300 MG PO CAPS
300.0000 mg | ORAL_CAPSULE | Freq: Two times a day (BID) | ORAL | Status: DC
  Administered 2017-09-12 – 2017-09-13 (×2): 300 mg via ORAL
  Filled 2017-09-12 (×2): qty 1

## 2017-09-12 MED ORDER — BUPRENORPHINE HCL-NALOXONE HCL 2-0.5 MG SL SUBL
2.0000 | SUBLINGUAL_TABLET | Freq: Every day | SUBLINGUAL | Status: DC
  Administered 2017-09-12: 2 via SUBLINGUAL
  Filled 2017-09-12: qty 2

## 2017-09-12 MED ORDER — AMLODIPINE BESYLATE 5 MG PO TABS
10.0000 mg | ORAL_TABLET | Freq: Every day | ORAL | Status: DC
  Administered 2017-09-12 – 2017-09-13 (×2): 10 mg via ORAL
  Filled 2017-09-12 (×2): qty 2

## 2017-09-12 MED ORDER — FUROSEMIDE 20 MG PO TABS
20.0000 mg | ORAL_TABLET | Freq: Every day | ORAL | Status: DC
  Administered 2017-09-12 – 2017-09-13 (×2): 20 mg via ORAL
  Filled 2017-09-12 (×2): qty 1

## 2017-09-12 MED ORDER — MORPHINE SULFATE (CONCENTRATE) 10 MG/0.5ML PO SOLN: 5.0000 mg | ORAL | Status: DC | PRN

## 2017-09-12 MED ORDER — INSULIN ASPART 100 UNIT/ML ~~LOC~~ SOLN
10.0000 [IU] | Freq: Three times a day (TID) | SUBCUTANEOUS | Status: DC
  Administered 2017-09-12 – 2017-09-13 (×3): 10 [IU] via SUBCUTANEOUS

## 2017-09-12 MED ORDER — INSULIN GLARGINE 100 UNIT/ML ~~LOC~~ SOLN
10.0000 [IU] | Freq: Once | SUBCUTANEOUS | Status: AC
  Administered 2017-09-12: 10 [IU] via SUBCUTANEOUS
  Filled 2017-09-12: qty 0.1

## 2017-09-12 MED ORDER — INSULIN ASPART 100 UNIT/ML ~~LOC~~ SOLN
0.0000 [IU] | Freq: Three times a day (TID) | SUBCUTANEOUS | Status: DC
  Administered 2017-09-12: 2 [IU] via SUBCUTANEOUS
  Administered 2017-09-12: 7 [IU] via SUBCUTANEOUS
  Administered 2017-09-12: 3 [IU] via SUBCUTANEOUS
  Administered 2017-09-13: 5 [IU] via SUBCUTANEOUS
  Administered 2017-09-13: 7 [IU] via SUBCUTANEOUS

## 2017-09-12 NOTE — Care Management (Signed)
CM consulted for medication needs due to patient having unexpected expenses for the month of April. Patient has [employer] and [employer] insurance. CM is unable to provide any assistance.

## 2017-09-12 NOTE — Progress Notes (Signed)
Patient lost IV access. Patient was not wanting another IV unless she needed it. Dr. Darnelle Catalanama notified.

## 2017-09-12 NOTE — Progress Notes (Signed)
Nutrition Education  RD drawn to patient to provide nutrition education regarding diabetes given her elevated A1C%.  She asked that the handout be left for her to read because she has just fallen asleep. RD provided "Plate Method" handout .   Lab Results  Component Value Date   HGBA1C 11.3 (H) 07/15/2017   BMP Latest Ref Rng & Units 09/12/2017 09/12/2017 09/12/2017  Glucose 65 - 99 mg/dL 161(W326(H) 960(A277(H) 540(J191(H)  BUN 6 - 20 mg/dL 6 6 6   Creatinine 0.44 - 1.00 mg/dL 8.110.58 9.140.63 7.820.57  Sodium 135 - 145 mmol/L 133(L) 134(L) 133(L)  Potassium 3.5 - 5.1 mmol/L 4.0 3.5 3.1(L)  Chloride 101 - 111 mmol/L 99(L) 99(L) 100(L)  CO2 22 - 32 mmol/L 21(L) 21(L) 23  Calcium 8.9 - 10.3 mg/dL 8.9 9.5(A8.8(L) 2.1(H8.6(L)    Filed Weights   09/11/17 1422 09/11/17 2338  Weight: 240 lb (108.9 kg) 232 lb 9.4 oz (105.5 kg)     Body mass index is 36.43 kg/m. Pt meets criteria for obese based on current BMI.  Current diet order is CHO modifed , patient is consuming approximately 75% of meals at this time.   Meds: . amLODipine  10 mg Oral Daily  . buprenorphine-naloxone  2 tablet Sublingual Q2000  . buprenorphine-naloxone  1 tablet Sublingual Daily  . cloNIDine  0.1 mg Oral BID  . DULoxetine  60 mg Oral Daily  . enoxaparin (LOVENOX) injection  0.5 mg/kg Subcutaneous Q24H  . furosemide  20 mg Oral Daily  . insulin aspart  0-9 Units Subcutaneous TID WC  . insulin aspart  10 Units Subcutaneous TID WC  . [START ON 09/13/2017] insulin glargine  50 Units Subcutaneous Daily  . lisinopril  40 mg Oral Daily  . metoCLOPramide (REGLAN) injection  10 mg Intravenous Q6H  . pantoprazole (PROTONIX) IV  40 mg Intravenous Q12H      No further nutrition interventions warranted at this time. RD contact information provided. If additional nutrition issues arise, please re-consult RD.  Royann ShiversLynn Athalia Setterlund MS,RD,CSG,LDN Office: [phone] Pager: [phone]

## 2017-09-12 NOTE — Progress Notes (Signed)
Progress Note    Deanna Stokes  ZOX:[id number] DOB: 06/05/1971  DOA: 09/11/2017 PCP: Toma DeitersHasanaj, Xaje A, MD    Brief Narrative:   Chief complaint: F/U nausea, vomiting and diarrhea  Medical records reviewed and are as summarized below:  Deanna Stokes is an 47 y.o. female with a PMH of diabetes, diabetic gastroparesis, depression, hypertension and GERD who was admitted 09/11/17 for evaluation of a 2-day history of nausea, vomiting and diarrhea.  In the ED, the patient was found to have DKA with an anion gap of 21.  CT of the abdomen was concerning for bilateral pyelonephritis.  Assessment/Plan:   Principal Problem:   DKA (diabetic ketoacidoses) (HCC)/type 2 diabetes uncontrolled Has transitioned to basal/bolus insulin and given Lantus 10 units.  Currently being managed with insulin sensitive SSI.  CBG is 130-206.  Given that she normally takes Lantus 50 units twice daily, she is likely to need much higher Lantus dosing.  We will increase this to her home dose.  Diabetes coordinator consultation requested, 10 units of NovoLog before meals added per recommendations.  Active Problems:   Opiate dependency Resume Suboxone.    Essential hypertension, benign Continue Catapres.  Norvasc, lisinopril and Lasix held on admission.  Blood pressure elevated.  Renal function normal.  Resume these medications.    G E R D Continue IV PPI therapy.    Diabetic gastroparesis (HCC) Continue IV Reglan.    Acute pyelonephritis CT of the abdomen and pelvis personally reviewed and shows bilateral wedge shaped densities concerning for pyelonephritis or nephronia.  Continue IV Rocephin.    Class 2 severe obesity due to excess calories with serious comorbidity and body mass index (BMI) of 39.0 to 39.9 in adult [facility](HCC) Body mass index is 36.43 kg/m.     Polypharmacy The patient is on Suboxone, Cymbalta, Neurontin, Robaxin, Phenergan, Ambien at baseline.  She is at high risk for adverse events.  At this  time, continue to hold Neurontin, Phenergan, Robaxin and Ambien.   Family Communication/Anticipated D/C date and plan/Code Status   DVT prophylaxis: Lovenox ordered. Code Status: Full Code.  Family Communication: No family currently at the bedside. Disposition Plan: Home 09/13/17 if nausea/vomiting resolved and she can be transitioned to oral antibiotics.   Medical Consultants:    None.   Anti-Infectives:    Rocephin 09/11/17--->  Subjective:   Patient reports that she has bilateral flank pain, mild nausea, and heartburn this morning.  No vomiting overnight.  No diarrhea.  Objective:    Vitals:   09/12/17 0400 09/12/17 0500 09/12/17 0600 09/12/17 0739  BP: (!) 146/108 (!) 148/94 (!) 143/94   Pulse: (!) 105 (!) 109 (!) 105 (!) 106  Resp: (!) 31 (!) 30 19 (!) 22  Temp: 98.9 F (37.2 C)   98.9 F (37.2 C)  TempSrc: Oral   Oral  SpO2: 97% 95% 96% 95%  Weight:      Height:        Intake/Output Summary (Last 24 hours) at 09/12/2017 0845 Last data filed at 09/12/2017 [id number] Gross per 24 hour  Intake 2552.5 ml  Output 1250 ml  Net 1302.5 ml   Filed Weights   09/11/17 1422 09/11/17 2338  Weight: 108.9 kg (240 lb) 105.5 kg (232 lb 9.4 oz)    Exam: General: Obese black female in no acute distress. Cardiovascular: Heart sounds are tachycardic, regular.  No gallops or rubs. No murmurs. No JVD. Lungs: Clear to auscultation bilaterally with good air movement. No  rales, rhonchi or wheezes. Abdomen: Soft, nontender, nondistended with normal active bowel sounds. No masses. No hepatosplenomegaly.  Positive costovertebral angle tenderness. Neurological: Alert and oriented 3. Moves all extremities 4 with equal strength. Cranial nerves II through XII grossly intact. Skin: Warm and dry. No rashes or lesions. Extremities: No clubbing or cyanosis. No edema. Pedal pulses 2+. Psychiatric: Mood and affect are normal. Insight and judgment are fair.   Data Reviewed:   I have personally  reviewed following labs and imaging studies:  Labs: Labs show the following:   Basic Metabolic Panel: Recent Labs  Lab 09/11/17 1449 09/12/17 0005 09/12/17 0407  NA 130* 132* 133*  K 3.8 3.3* 3.1*  CL 92* 98* 100*  CO2 17* 23 23  GLUCOSE 458* 237* 191*  BUN 13 8 6   CREATININE 0.79 0.57 0.57  CALCIUM 10.2 8.8* 8.6*   GFR Estimated Creatinine Clearance: 111 mL/min (by C-G formula based on SCr of 0.57 mg/dL). Liver Function Tests: Recent Labs  Lab 09/11/17 1449  AST 17  ALT 20  ALKPHOS 171*  BILITOT 1.2  PROT 9.3*  ALBUMIN 4.0   Recent Labs  Lab 09/11/17 1449  LIPASE 22   CBC: Recent Labs  Lab 09/11/17 1449  WBC 15.8*  HGB 12.4  HCT 37.1  MCV 83.0  PLT 615*   CBG: Recent Labs  Lab 09/12/17 0321 09/12/17 0428 09/12/17 0534 09/12/17 0629 09/12/17 0729  GLUCAP 194* 196* 139* 130* 206*   Microbiology No results found for this or any previous visit (from the past 240 hour(s)).  Procedures and diagnostic studies:  Ct Abdomen Pelvis W Contrast  Result Date: 09/11/2017 CLINICAL DATA:  Nausea and vomiting. EXAM: CT ABDOMEN AND PELVIS WITH CONTRAST TECHNIQUE: Multidetector CT imaging of the abdomen and pelvis was performed using the standard protocol following bolus administration of intravenous contrast. CONTRAST:  ISOVUE-300 IOPAMIDOL (ISOVUE-300) INJECTION 61% COMPARISON:  CT scan of July 14, 2017. FINDINGS: Lower chest: No acute abnormality. Hepatobiliary: No focal liver abnormality is seen. Status post cholecystectomy. No biliary dilatation. Pancreas: Unremarkable. No pancreatic ductal dilatation or surrounding inflammatory changes. Spleen: Normal in size without focal abnormality. Adrenals/Urinary Tract: Adrenal glands appear normal. Wedge-shaped low density is seen in midpole of left kidney as well as in anterior portion of right kidney. This is concerning for bilateral lobar nephronia or pyelonephritis. No hydronephrosis or renal obstruction is  noted. Urinary bladder is unremarkable. Stomach/Bowel: Stomach is within normal limits. Appendix appears normal. No evidence of bowel wall thickening, distention, or inflammatory changes. Vascular/Lymphatic: No significant vascular findings are present. No enlarged abdominal or pelvic lymph nodes. Reproductive: Status post hysterectomy. No adnexal masses. Other: No abdominal wall hernia or abnormality. No abdominopelvic ascites. Musculoskeletal: No acute or significant osseous findings. IMPRESSION: Wedge-shaped low density is noted in midpole of left kidney as well as involving anterior portion of right kidney. This is concerning for possible bilateral lobar nephronia or pyelonephritis. Clinical correlation is recommended. Electronically Signed   By: Lupita Raider, M.D.   On: 09/11/2017 21:02    Medications:   . cloNIDine  0.1 mg Oral BID  . DULoxetine  60 mg Oral Daily  . enoxaparin (LOVENOX) injection  0.5 mg/kg Subcutaneous Q24H  . insulin aspart  0-9 Units Subcutaneous TID WC  . metoCLOPramide (REGLAN) injection  10 mg Intravenous Q6H  . pantoprazole (PROTONIX) IV  40 mg Intravenous Q12H   Continuous Infusions: . sodium chloride 100 mL/hr at 09/12/17 1610  . cefTRIAXone (ROCEPHIN)  IV    .  dextrose 5 % and 0.45% NaCl Stopped (09/12/17 0600)     LOS: 1 day   Hillery Aldo  [facility] Pager [phone]. If unable to reach me by pager, please call my cell phone at [phone].  *Please refer to [email or web address], password [email or web address] to get updated schedule on who will round on this patient, as hospitalists switch teams weekly. If 7PM-7AM, please contact night-coverage at [email or web address], password [email or web address] for any overnight needs.  09/12/2017, 8:45 AM

## 2017-09-12 NOTE — Clinical Social Work Note (Signed)
CSW referred for medication needs. CM consulted. Patient has [employer] Medicare and [employer] Insurance. CM is unable to provide any assistance.     Cage Gupton, Juleen ChinaHeather D, LCSW

## 2017-09-12 NOTE — Progress Notes (Signed)
Inpatient Diabetes Program Recommendations  AACE/ADA: New Consensus Statement on Inpatient Glycemic Control (2015)  Target Ranges:  Prepandial:   less than 140 mg/dL      Peak postprandial:   less than 180 mg/dL (1-2 hours)      Critically ill patients:  140 - 180 mg/dL   Lab Results  Component Value Date   GLUCAP 340 (H) 09/12/2017   HGBA1C 11.3 (H) 07/15/2017    Review of Glycemic Control  Diabetes history: DM Outpatient Diabetes medications: Lantus 60 units bid + Novolog 70/30 insulin mix 30 units tid ac meals + Amaryl 4 mg qd Current orders for Inpatient glycemic control: Lantus 50 units qd  Inpatient Diabetes Program Recommendations:    -A1c for update of glycemic control -Novolog 10 units tid meal coverage if eats 50% -Increase Lantus to 60 units daily (50% home dose)  Spoke with patient by phone ([facility]) regarding diabetes management. Patient states she was followed by Dr. Fransico HimNida in the past but changed to Dr. Olena LeatherwoodHasanaj managing her diabetes. Reviewed A1c from 07/15/17 of 11.3 with patient but patient does not know if she has a newer result or not @ her PCP office. Patient states she is having a hard time affording medications and plans to call some local churches to evaluate if funds available since funds not available due to currently has Medicaid & Medicare. This is an unusual regimen of insulin regarding 70/30 insulin mix ac meals instead of Novolog. Patient was previously on Novolog ac meals when seeing Dr. Fransico HimNida. Patient states she is checking her CBGs "some".  Thank you, Billy FischerJudy E. Arliene Rosenow, RN, MSN, CDE  Diabetes Coordinator Inpatient Glycemic Control Team Team Pager [phone] (8am-5pm) 09/12/2017 12:12 PM

## 2017-09-13 DIAGNOSIS — E1110 Type 2 diabetes mellitus with ketoacidosis without coma: Secondary | ICD-10-CM | POA: Diagnosis not present

## 2017-09-13 DIAGNOSIS — R112 Nausea with vomiting, unspecified: Secondary | ICD-10-CM | POA: Diagnosis not present

## 2017-09-13 LAB — GLUCOSE, CAPILLARY
GLUCOSE-CAPILLARY: 331 mg/dL — AB (ref 65–99)
Glucose-Capillary: 299 mg/dL — ABNORMAL HIGH (ref 65–99)

## 2017-09-13 LAB — URINE CULTURE

## 2017-09-13 MED ORDER — CEFDINIR 300 MG PO CAPS: 300.0000 mg | ORAL_CAPSULE | Freq: Two times a day (BID) | ORAL | 0 refills | Status: AC

## 2017-09-13 MED ORDER — ONDANSETRON 4 MG PO TBDP: 4.0000 mg | ORAL_TABLET | Freq: Three times a day (TID) | ORAL | Status: DC | PRN

## 2017-09-13 MED ORDER — INSULIN GLARGINE 100 UNIT/ML ~~LOC~~ SOLN
60.0000 [IU] | Freq: Every day | SUBCUTANEOUS | Status: DC
  Administered 2017-09-13: 60 [IU] via SUBCUTANEOUS
  Filled 2017-09-13 (×2): qty 0.6

## 2017-09-13 MED ORDER — PROMETHAZINE HCL 25 MG/ML IJ SOLN
12.5000 mg | Freq: Once | INTRAMUSCULAR | Status: AC
  Administered 2017-09-13: 12.5 mg via INTRAVENOUS
  Filled 2017-09-13: qty 1

## 2017-09-13 NOTE — Discharge Summary (Signed)
Physician Discharge Summary  Deanna Stokes:[id number] DOB: [birth year] DOA: 09/11/2017  PCP: Toma Deiters, MD  Admit date: 09/11/2017  Discharge date: 09/13/2017  Admitted From:Home  Disposition:  Home  Recommendations for Outpatient Follow-up:  1. Follow up with PCP in 1-2 weeks to follow up BG control  Home Health:No  Equipment/Devices:None  Discharge Condition:Stable  CODE STATUS: Full  Diet recommendation: Heart Healthy/Carb modified  Brief/Interim Summary:  Deanna Stokes is an 47 y.o. female with a PMH of diabetes, diabetic gastroparesis, depression, hypertension and GERD who was admitted 09/11/17 for evaluation of a 2-day history of nausea, vomiting and diarrhea.  In the ED, the patient was found to have DKA with an anion gap of 21.  CT of the abdomen was concerning for bilateral pyelonephritis.  She was given 3 days of IV Rocephin and has been switched to oral Omnicef which she is currently tolerating well.  She will continue this for another 7-day course to finish a 10-day course of treatment empirically for pyelonephritis.  Urine analysis did not demonstrate any findings of UTI.  Patient is now tolerating a diet and has antiemetic medications at home as needed.  She claims to continue to have little appetite as well as some ongoing low back pain.  She states that despite recommendations with diabetes coordinator regarding insulin here, that she would like to go back to her usual home dosing.   Discharge Diagnoses:  Principal Problem:   DKA (diabetic ketoacidoses) (HCC) Active Problems:   Essential hypertension, benign   G E R D   Type II diabetes mellitus, uncontrolled (HCC)   Class 2 severe obesity due to excess calories with serious comorbidity and body mass index (BMI) of 39.0 to 39.9 in adult [facility])   Diabetic gastroparesis (HCC)   Acute pyelonephritis   Polypharmacy  1. DKA secondary to uncontrolled type 2 diabetes.  Continue usual home Lantus dosing.  Much of  this may be related to the fact that she has developed acute pyelonephritis which is now improved with antibiotic use.  She states that she is otherwise compliant with her home insulin regimen and follows up regularly.  She currently has poor appetite and is not eating much of a diet and therefore I would not recommend mealtime insulin. 2. Acute pyelonephritis.  Finish course of Omnicef for 7 more days for total 10-day course of treatment. 3. Opiate dependency.  Continue home Suboxone. 4. Hypertension.  Continue home antihypertensives. 5. GERD.  Continue PPI. 6. Diabetic gastroparesis.  Continue home Reglan. 7. Polypharmacy.  Patient is at high risk of adverse events given use of multiple medications.  Recommended discontinuation of home Ambien and Robaxin as well as Neurontin.  Discharge Instructions  Discharge Instructions    Diet - low sodium heart healthy   Complete by:  As directed    Increase activity slowly   Complete by:  As directed      Allergies as of 09/13/2017      Reactions   Ibuprofen Shortness Of Breath, Rash   Ciprofloxacin Hives   Metformin And Related Other (See Comments)   MD told patient not to take   Hydrocodone-acetaminophen Rash   Pt states she can take acetaminophen      Medication List    STOP taking these medications   gabapentin 600 MG tablet Commonly known as:  NEURONTIN   methocarbamol 500 MG tablet Commonly known as:  ROBAXIN   zolpidem 10 MG tablet Commonly known as:  AMBIEN  TAKE these medications   amLODipine 5 MG tablet Commonly known as:  NORVASC Take 10 mg by mouth daily.   aspirin EC 81 MG tablet Take 81 mg by mouth daily.   atorvastatin 20 MG tablet Commonly known as:  LIPITOR Take 20 mg by mouth daily.   buprenorphine 8 MG Subl SL tablet Commonly known as:  SUBUTEX Place 4-8 mg under the tongue 2 (two) times daily. 8MG  IN THE MORNING AND 4MG  IN THE EVENING   cefdinir 300 MG capsule Commonly known as:  OMNICEF Take 1  capsule (300 mg total) by mouth every 12 (twelve) hours for 7 days.   cloNIDine 0.1 MG tablet Commonly known as:  CATAPRES Take 0.1 mg by mouth 2 (two) times daily.   DULoxetine 60 MG capsule Commonly known as:  CYMBALTA Take 60 mg by mouth daily.   esomeprazole 40 MG capsule Commonly known as:  NEXIUM Take 1 capsule (40 mg total) by mouth 2 (two) times daily before a meal.   estradiol 0.075 mg/24hr patch Commonly known as:  CLIMARA - Dosed in mg/24 hr Place 0.075 mg onto the skin every 3 (three) days.   FREESTYLE LIBRE SENSOR SYSTEM Misc Use one sensor every 10 days.   furosemide 20 MG tablet Commonly known as:  LASIX Take 20 mg by mouth daily.   glimepiride 4 MG tablet Commonly known as:  AMARYL Take 4 mg by mouth daily with breakfast.   glucagon 1 MG injection Commonly known as:  GLUCAGON EMERGENCY Inject 1 mg into the vein once as needed.   hydrOXYzine 25 MG tablet Commonly known as:  ATARAX/VISTARIL Take 25 mg by mouth 3 (three) times daily as needed for anxiety.   Insulin Glargine 100 UNIT/ML Solostar Pen Commonly known as:  LANTUS SOLOSTAR Inject 50 Units into the skin 2 times daily at 12 noon and 4 pm.   lisinopril 40 MG tablet Commonly known as:  PRINIVIL,ZESTRIL Take 40 mg by mouth daily.   metoCLOPramide 10 MG tablet Commonly known as:  REGLAN Take 1 tablet (10 mg total) by mouth 3 (three) times daily before meals for 60 doses.   omega-3 acid ethyl esters 1 g capsule Commonly known as:  LOVAZA Take 1 g by mouth 2 (two) times daily. OMACOR   ondansetron 8 MG disintegrating tablet Commonly known as:  ZOFRAN ODT Take 1 tablet (8 mg total) by mouth every 8 (eight) hours as needed for nausea or vomiting.   promethazine 6.25 MG/5ML syrup Commonly known as:  PHENERGAN Take 25 mg by mouth every 6 (six) hours as needed for nausea or vomiting.   traZODone 50 MG tablet Commonly known as:  DESYREL Take 50 mg by mouth at bedtime as needed for sleep.    Vitamin D (Ergocalciferol) 50000 units Caps capsule Commonly known as:  DRISDOL Take 50,000 Units by mouth 2 (two) times a week. Sunday and Thursday      Follow-up Information    Hasanaj, Myra Gianotti, MD Follow up in 1 week(s).   Specialty:  Internal Medicine Contact information: [address] [place] [address] [phone]          Allergies  Allergen Reactions  . Ibuprofen Shortness Of Breath and Rash  . Ciprofloxacin Hives  . Metformin And Related Other (See Comments)    MD told patient not to take  . Hydrocodone-Acetaminophen Rash    Pt states she can take acetaminophen    Consultations:  None   Procedures/Studies: Ct Abdomen Pelvis  W Contrast  Result Date: 09/11/2017 CLINICAL DATA:  Nausea and vomiting. EXAM: CT ABDOMEN AND PELVIS WITH CONTRAST TECHNIQUE: Multidetector CT imaging of the abdomen and pelvis was performed using the standard protocol following bolus administration of intravenous contrast. CONTRAST:  ISOVUE-300 IOPAMIDOL (ISOVUE-300) INJECTION 61% COMPARISON:  CT scan of July 14, 2017. FINDINGS: Lower chest: No acute abnormality. Hepatobiliary: No focal liver abnormality is seen. Status post cholecystectomy. No biliary dilatation. Pancreas: Unremarkable. No pancreatic ductal dilatation or surrounding inflammatory changes. Spleen: Normal in size without focal abnormality. Adrenals/Urinary Tract: Adrenal glands appear normal. Wedge-shaped low density is seen in midpole of left kidney as well as in anterior portion of right kidney. This is concerning for bilateral lobar nephronia or pyelonephritis. No hydronephrosis or renal obstruction is noted. Urinary bladder is unremarkable. Stomach/Bowel: Stomach is within normal limits. Appendix appears normal. No evidence of bowel wall thickening, distention, or inflammatory changes. Vascular/Lymphatic: No significant vascular findings are present. No enlarged abdominal or pelvic lymph nodes. Reproductive: Status  post hysterectomy. No adnexal masses. Other: No abdominal wall hernia or abnormality. No abdominopelvic ascites. Musculoskeletal: No acute or significant osseous findings. IMPRESSION: Wedge-shaped low density is noted in midpole of left kidney as well as involving anterior portion of right kidney. This is concerning for possible bilateral lobar nephronia or pyelonephritis. Clinical correlation is recommended. Electronically Signed   By: Lupita Raider, M.D.   On: 09/11/2017 21:02    Discharge Exam: Vitals:   09/12/17 2100 09/13/17 0548  BP: (!) 145/98 (!) 151/95  Pulse: (!) 103 98  Resp: (!) 24   Temp: 98.4 F (36.9 C) 98.2 F (36.8 C)  SpO2: 100% 100%   Vitals:   09/12/17 1858 09/12/17 2055 09/12/17 2100 09/13/17 0548  BP: (!) 144/95  (!) 145/98 (!) 151/95  Pulse: 99  (!) 103 98  Resp: 16  (!) 24   Temp: 98.9 F (37.2 C)  98.4 F (36.9 C) 98.2 F (36.8 C)  TempSrc: Oral  Oral Oral  SpO2: 100% 100% 100% 100%  Weight:      Height:        General: Pt is alert, awake, not in acute distress Cardiovascular: RRR, S1/S2 +, no rubs, no gallops Respiratory: CTA bilaterally, no wheezing, no rhonchi Abdominal: Soft, NT, ND, bowel sounds + Extremities: no edema, no cyanosis    The results of significant diagnostics from this hospitalization (including imaging, microbiology, ancillary and laboratory) are listed below for reference.     Microbiology: Recent Results (from the past 240 hour(s))  Urine culture     Status: Abnormal   Collection Time: 09/11/17  4:16 PM  Result Value Ref Range Status   Specimen Description   Final    URINE, CLEAN CATCH Performed at [facility], [address], [place], [place] [address]    Special Requests   Final    NONE Performed at [facility], [address] [place], [place] [address]    Culture MULTIPLE SPECIES PRESENT, SUGGEST RECOLLECTION (A)  Final   Report Status 09/13/2017 FINAL  Final  MRSA PCR Screening     Status: None   Collection  Time: 09/11/17 11:29 PM  Result Value Ref Range Status   MRSA by PCR NEGATIVE NEGATIVE Final    Comment:        The GeneXpert MRSA Assay (FDA approved for NASAL specimens only), is one component of a comprehensive MRSA colonization surveillance program. It is not intended to diagnose MRSA infection nor to guide or monitor  treatment for MRSA infections. Performed at [facility], [address] [place], [place] [address]      Labs: BNP (last 3 results) No results for input(s): BNP in the last 8760 hours. Basic Metabolic Panel: Recent Labs  Lab 09/11/17 1449 09/12/17 0005 09/12/17 0407 09/12/17 0827 09/12/17 1134  NA 130* 132* 133* 134* 133*  K 3.8 3.3* 3.1* 3.5 4.0  CL 92* 98* 100* 99* 99*  CO2 17* 23 23 21* 21*  GLUCOSE 458* 237* 191* 277* 326*  BUN 13 8 6 6 6   CREATININE 0.79 0.57 0.57 0.63 0.58  CALCIUM 10.2 8.8* 8.6* 8.8* 8.9   Liver Function Tests: Recent Labs  Lab 09/11/17 1449  AST 17  ALT 20  ALKPHOS 171*  BILITOT 1.2  PROT 9.3*  ALBUMIN 4.0   Recent Labs  Lab 09/11/17 1449  LIPASE 22   No results for input(s): AMMONIA in the last 168 hours. CBC: Recent Labs  Lab 09/11/17 1449  WBC 15.8*  HGB 12.4  HCT 37.1  MCV 83.0  PLT 615*   Cardiac Enzymes: No results for input(s): CKTOTAL, CKMB, CKMBINDEX, TROPONINI in the last 168 hours. BNP: Invalid input(s): POCBNP CBG: Recent Labs  Lab 09/12/17 1140 09/12/17 1639 09/12/17 2104 09/13/17 0758 09/13/17 1159  GLUCAP 340* 196* 278* 331* 299*   D-Dimer No results for input(s): DDIMER in the last 72 hours. Hgb A1c No results for input(s): HGBA1C in the last 72 hours. Lipid Profile No results for input(s): CHOL, HDL, LDLCALC, TRIG, CHOLHDL, LDLDIRECT in the last 72 hours. Thyroid function studies No results for input(s): TSH, T4TOTAL, T3FREE, THYROIDAB in the last 72 hours.  Invalid input(s): FREET3 Anemia work up No results for input(s): VITAMINB12, FOLATE, FERRITIN, TIBC, IRON,  RETICCTPCT in the last 72 hours. Urinalysis    Component Value Date/Time   COLORURINE YELLOW 09/11/2017 1449   APPEARANCEUR HAZY (A) 09/11/2017 1449   LABSPEC 1.021 09/11/2017 1449   PHURINE 6.0 09/11/2017 1449   GLUCOSEU >=500 (A) 09/11/2017 1449   HGBUR NEGATIVE 09/11/2017 1449   BILIRUBINUR NEGATIVE 09/11/2017 1449   KETONESUR 80 (A) 09/11/2017 1449   PROTEINUR NEGATIVE 09/11/2017 1449   NITRITE NEGATIVE 09/11/2017 1449   LEUKOCYTESUR LARGE (A) 09/11/2017 1449   Sepsis Labs Invalid input(s): PROCALCITONIN,  WBC,  LACTICIDVEN Microbiology Recent Results (from the past 240 hour(s))  Urine culture     Status: Abnormal   Collection Time: 09/11/17  4:16 PM  Result Value Ref Range Status   Specimen Description   Final    URINE, CLEAN CATCH Performed at [facility], [address] [place], [place] [address]    Special Requests   Final    NONE Performed at [facility], [address] [place], [place] [address]    Culture MULTIPLE SPECIES PRESENT, SUGGEST RECOLLECTION (A)  Final   Report Status 09/13/2017 FINAL  Final  MRSA PCR Screening     Status: None   Collection Time: 09/11/17 11:29 PM  Result Value Ref Range Status   MRSA by PCR NEGATIVE NEGATIVE Final    Comment:        The GeneXpert MRSA Assay (FDA approved for NASAL specimens only), is one component of a comprehensive MRSA colonization surveillance program. It is not intended to diagnose MRSA infection nor to guide or monitor treatment for MRSA infections. Performed at [facility], [address] [place], [place] [address]      Time coordinating discharge: 35 minutes  SIGNED:   Erick BlinksPratik D Mae Cianci, DO  [facility] 09/13/2017, 1:09 PM Pager [phone]  If 7PM-7AM, please contact night-coverage [email or web address] Password [email or web address]

## 2017-09-13 NOTE — Progress Notes (Signed)
Pt discharged home today per Dr. Shah. Pt's IV site D/C'd and WDL. Pt's VSS. Pt provided with home medication list, discharge instructions and prescriptions. Verbalized understanding. Pt left floor via WC in stable condition accompanied by NT. 

## 2018-03-29 ENCOUNTER — Telehealth (HOSPITAL_COMMUNITY): Payer: Self-pay | Admitting: Licensed Clinical Social Worker

## 2018-03-31 ENCOUNTER — Other Ambulatory Visit: Payer: Self-pay

## 2018-03-31 ENCOUNTER — Inpatient Hospital Stay (HOSPITAL_COMMUNITY)
Admission: EM | Admit: 2018-03-31 | Discharge: 2018-04-04 | DRG: 639 | Disposition: A | Discharge status: Home / Self Care | Payer: Medicare HMO | Attending: Family Medicine | Admitting: Family Medicine

## 2018-03-31 ENCOUNTER — Encounter (HOSPITAL_COMMUNITY): Payer: Self-pay

## 2018-03-31 DIAGNOSIS — E1165 Type 2 diabetes mellitus with hyperglycemia: Secondary | ICD-10-CM

## 2018-03-31 DIAGNOSIS — Z885 Allergy status to narcotic agent status: Secondary | ICD-10-CM | POA: Diagnosis not present

## 2018-03-31 DIAGNOSIS — R739 Hyperglycemia, unspecified: Secondary | ICD-10-CM | POA: Diagnosis not present

## 2018-03-31 DIAGNOSIS — F1110 Opioid abuse, uncomplicated: Secondary | ICD-10-CM | POA: Diagnosis present

## 2018-03-31 DIAGNOSIS — R112 Nausea with vomiting, unspecified: Secondary | ICD-10-CM

## 2018-03-31 DIAGNOSIS — E1143 Type 2 diabetes mellitus with diabetic autonomic (poly)neuropathy: Secondary | ICD-10-CM | POA: Diagnosis not present

## 2018-03-31 DIAGNOSIS — E1110 Type 2 diabetes mellitus with ketoacidosis without coma: Secondary | ICD-10-CM | POA: Diagnosis present

## 2018-03-31 DIAGNOSIS — Z7982 Long term (current) use of aspirin: Secondary | ICD-10-CM | POA: Diagnosis not present

## 2018-03-31 DIAGNOSIS — I10 Essential (primary) hypertension: Secondary | ICD-10-CM

## 2018-03-31 DIAGNOSIS — R680 Hypothermia, not associated with low environmental temperature: Secondary | ICD-10-CM | POA: Diagnosis present

## 2018-03-31 DIAGNOSIS — Z794 Long term (current) use of insulin: Secondary | ICD-10-CM | POA: Diagnosis not present

## 2018-03-31 DIAGNOSIS — R1110 Vomiting, unspecified: Secondary | ICD-10-CM | POA: Diagnosis present

## 2018-03-31 DIAGNOSIS — F419 Anxiety disorder, unspecified: Secondary | ICD-10-CM | POA: Diagnosis present

## 2018-03-31 DIAGNOSIS — F329 Major depressive disorder, single episode, unspecified: Secondary | ICD-10-CM | POA: Diagnosis present

## 2018-03-31 DIAGNOSIS — Z79899 Other long term (current) drug therapy: Secondary | ICD-10-CM

## 2018-03-31 DIAGNOSIS — Z6839 Body mass index (BMI) 39.0-39.9, adult: Secondary | ICD-10-CM | POA: Diagnosis not present

## 2018-03-31 DIAGNOSIS — Z888 Allergy status to other drugs, medicaments and biological substances status: Secondary | ICD-10-CM | POA: Diagnosis not present

## 2018-03-31 DIAGNOSIS — K3184 Gastroparesis: Secondary | ICD-10-CM | POA: Diagnosis present

## 2018-03-31 DIAGNOSIS — E785 Hyperlipidemia, unspecified: Secondary | ICD-10-CM | POA: Diagnosis present

## 2018-03-31 HISTORY — DX: Gastroparesis: K31.84

## 2018-03-31 LAB — CBC WITH DIFFERENTIAL/PLATELET
Abs Immature Granulocytes: 0.08 10*3/uL — ABNORMAL HIGH (ref 0.00–0.07)
BASOS ABS: 0.1 10*3/uL (ref 0.0–0.1)
Basophils Relative: 1 %
EOS PCT: 1 %
Eosinophils Absolute: 0.1 10*3/uL (ref 0.0–0.5)
HEMATOCRIT: 38.3 % (ref 36.0–46.0)
Hemoglobin: 12.4 g/dL (ref 12.0–15.0)
Immature Granulocytes: 1 %
LYMPHS ABS: 2.6 10*3/uL (ref 0.7–4.0)
Lymphocytes Relative: 19 %
MCH: 27 pg (ref 26.0–34.0)
MCHC: 32.4 g/dL (ref 30.0–36.0)
MCV: 83.3 fL (ref 80.0–100.0)
Monocytes Absolute: 0.7 10*3/uL (ref 0.1–1.0)
Monocytes Relative: 5 %
NRBC: 0 % (ref 0.0–0.2)
Neutro Abs: 10 10*3/uL — ABNORMAL HIGH (ref 1.7–7.7)
Neutrophils Relative %: 73 %
PLATELETS: 538 10*3/uL — AB (ref 150–400)
RBC: 4.6 MIL/uL (ref 3.87–5.11)
RDW: 13.1 % (ref 11.5–15.5)
WBC: 13.5 10*3/uL — AB (ref 4.0–10.5)

## 2018-03-31 LAB — HEMOGLOBIN A1C
Hgb A1c MFr Bld: 10.9 % — ABNORMAL HIGH (ref 4.8–5.6)
Mean Plasma Glucose: 266.13 mg/dL

## 2018-03-31 LAB — COMPREHENSIVE METABOLIC PANEL
ALBUMIN: 4 g/dL (ref 3.5–5.0)
ALK PHOS: 144 U/L — AB (ref 38–126)
ALT: 17 U/L (ref 0–44)
AST: 17 U/L (ref 15–41)
Anion gap: 13 (ref 5–15)
BILIRUBIN TOTAL: 1 mg/dL (ref 0.3–1.2)
BUN: 8 mg/dL (ref 6–20)
CALCIUM: 9.4 mg/dL (ref 8.9–10.3)
CO2: 19 mmol/L — ABNORMAL LOW (ref 22–32)
CREATININE: 0.76 mg/dL (ref 0.44–1.00)
Chloride: 103 mmol/L (ref 98–111)
GFR calc Af Amer: >60 mL/min (ref >60)
GFR calc non Af Amer: >60 mL/min (ref >60)
GLUCOSE: 425 mg/dL — AB (ref 70–99)
Potassium: 3.5 mmol/L (ref 3.5–5.1)
Sodium: 135 mmol/L (ref 135–145)
TOTAL PROTEIN: 8.9 g/dL — AB (ref 6.5–8.1)

## 2018-03-31 LAB — BASIC METABOLIC PANEL
Anion gap: 12 (ref 5–15)
Anion gap: 14 (ref 5–15)
Anion gap: 9 (ref 5–15)
BUN: 8 mg/dL (ref 6–20)
BUN: 8 mg/dL (ref 6–20)
BUN: 8 mg/dL (ref 6–20)
CHLORIDE: 108 mmol/L (ref 98–111)
CHLORIDE: 108 mmol/L (ref 98–111)
CHLORIDE: 111 mmol/L (ref 98–111)
CO2: 19 mmol/L — ABNORMAL LOW (ref 22–32)
CO2: 16 mmol/L — ABNORMAL LOW (ref 22–32)
CO2: 19 mmol/L — AB (ref 22–32)
CREATININE: 0.67 mg/dL (ref 0.44–1.00)
CREATININE: 0.65 mg/dL (ref 0.44–1.00)
CREATININE: 0.58 mg/dL (ref 0.44–1.00)
Calcium: 9.4 mg/dL (ref 8.9–10.3)
Calcium: 9.4 mg/dL (ref 8.9–10.3)
Calcium: 9.3 mg/dL (ref 8.9–10.3)
GFR calc Af Amer: >60 mL/min (ref >60)
GFR calc Af Amer: >60 mL/min (ref >60)
GFR calc Af Amer: >60 mL/min (ref >60)
GFR calc non Af Amer: >60 mL/min (ref >60)
GFR calc non Af Amer: >60 mL/min (ref >60)
GFR calc non Af Amer: >60 mL/min (ref >60)
GLUCOSE: 217 mg/dL — AB (ref 70–99)
GLUCOSE: 265 mg/dL — AB (ref 70–99)
GLUCOSE: 142 mg/dL — AB (ref 70–99)
POTASSIUM: 2.9 mmol/L — AB (ref 3.5–5.1)
Potassium: 3.7 mmol/L (ref 3.5–5.1)
Potassium: 3.6 mmol/L (ref 3.5–5.1)
SODIUM: 139 mmol/L (ref 135–145)
SODIUM: 138 mmol/L (ref 135–145)
SODIUM: 139 mmol/L (ref 135–145)

## 2018-03-31 LAB — URINALYSIS, ROUTINE W REFLEX MICROSCOPIC
Bacteria, UA: NONE SEEN
Bilirubin Urine: NEGATIVE
Hgb urine dipstick: NEGATIVE
Ketones, ur: 80 mg/dL — AB
Nitrite: NEGATIVE
PROTEIN: NEGATIVE mg/dL
Specific Gravity, Urine: 1.029 (ref 1.005–1.030)
pH: 7 (ref 5.0–8.0)

## 2018-03-31 LAB — CBG MONITORING, ED
GLUCOSE-CAPILLARY: 380 mg/dL — AB (ref 70–99)
Glucose-Capillary: 390 mg/dL — ABNORMAL HIGH (ref 70–99)
Glucose-Capillary: 369 mg/dL — ABNORMAL HIGH (ref 70–99)
Glucose-Capillary: 340 mg/dL — ABNORMAL HIGH (ref 70–99)

## 2018-03-31 LAB — RAPID URINE DRUG SCREEN, HOSP PERFORMED
AMPHETAMINES: NOT DETECTED
BENZODIAZEPINES: NOT DETECTED
Barbiturates: NOT DETECTED
Cocaine: NOT DETECTED
OPIATES: NOT DETECTED
TETRAHYDROCANNABINOL: NOT DETECTED

## 2018-03-31 LAB — LIPASE, BLOOD: Lipase: 27 U/L (ref 11–51)

## 2018-03-31 LAB — MRSA PCR SCREENING: MRSA by PCR: NEGATIVE

## 2018-03-31 LAB — PROCALCITONIN: Procalcitonin: <0.1 ng/mL

## 2018-03-31 MED ORDER — SODIUM CHLORIDE 0.9 % IV BOLUS
1000.0000 mL | Freq: Once | INTRAVENOUS | Status: AC
  Administered 2018-03-31: 1000 mL via INTRAVENOUS

## 2018-03-31 MED ORDER — INSULIN REGULAR(HUMAN) IN NACL 100-0.9 UT/100ML-% IV SOLN
INTRAVENOUS | Status: DC
  Administered 2018-03-31: 9.7 [IU]/h via INTRAVENOUS
  Filled 2018-03-31 (×3): qty 100

## 2018-03-31 MED ORDER — TRAZODONE HCL 50 MG PO TABS
50.0000 mg | ORAL_TABLET | Freq: Every evening | ORAL | Status: DC | PRN
  Administered 2018-03-31 – 2018-04-03 (×4): 50 mg via ORAL
  Filled 2018-03-31 (×4): qty 1

## 2018-03-31 MED ORDER — SODIUM CHLORIDE 0.9 % IV SOLN
INTRAVENOUS | Status: AC
  Administered 2018-03-31: 13:00:00 via INTRAVENOUS

## 2018-03-31 MED ORDER — INSULIN REGULAR(HUMAN) IN NACL 100-0.9 UT/100ML-% IV SOLN
INTRAVENOUS | Status: DC
  Administered 2018-03-31: 3.2 [IU]/h via INTRAVENOUS
  Administered 2018-03-31: 6.2 [IU]/h via INTRAVENOUS
  Administered 2018-03-31: 8.4 [IU]/h via INTRAVENOUS
  Filled 2018-03-31: qty 100

## 2018-03-31 MED ORDER — ENOXAPARIN SODIUM 40 MG/0.4ML ~~LOC~~ SOLN: 40.0000 mg | SUBCUTANEOUS | Status: DC

## 2018-03-31 MED ORDER — PROMETHAZINE HCL 25 MG/ML IJ SOLN
12.5000 mg | Freq: Once | INTRAMUSCULAR | Status: AC
  Administered 2018-03-31: 12.5 mg via INTRAVENOUS
  Filled 2018-03-31: qty 1

## 2018-03-31 MED ORDER — DEXTROSE-NACL 5-0.45 % IV SOLN: INTRAVENOUS | Status: DC

## 2018-03-31 MED ORDER — FAMOTIDINE IN NACL 20-0.9 MG/50ML-% IV SOLN
20.0000 mg | Freq: Two times a day (BID) | INTRAVENOUS | Status: DC
  Administered 2018-04-01 – 2018-04-04 (×6): 20 mg via INTRAVENOUS
  Filled 2018-03-31 (×7): qty 50

## 2018-03-31 MED ORDER — ESTRADIOL 0.075 MG/24HR TD PTWK: 0.0750 mg | MEDICATED_PATCH | TRANSDERMAL | Status: DC

## 2018-03-31 MED ORDER — ENOXAPARIN SODIUM 60 MG/0.6ML ~~LOC~~ SOLN
50.0000 mg | SUBCUTANEOUS | Status: DC
  Administered 2018-03-31 – 2018-04-03 (×4): 50 mg via SUBCUTANEOUS
  Filled 2018-03-31 (×4): qty 0.6

## 2018-03-31 MED ORDER — DULOXETINE HCL 60 MG PO CPEP
60.0000 mg | ORAL_CAPSULE | Freq: Every day | ORAL | Status: DC
  Administered 2018-04-02 – 2018-04-04 (×3): 60 mg via ORAL
  Filled 2018-03-31 (×3): qty 1

## 2018-03-31 MED ORDER — MORPHINE SULFATE (PF) 2 MG/ML IV SOLN
2.0000 mg | INTRAVENOUS | Status: DC | PRN
  Administered 2018-03-31 – 2018-04-02 (×11): 2 mg via INTRAVENOUS
  Filled 2018-03-31 (×11): qty 1

## 2018-03-31 MED ORDER — POTASSIUM CHLORIDE 10 MEQ/100ML IV SOLN
10.0000 meq | INTRAVENOUS | Status: AC
  Administered 2018-03-31 (×2): 10 meq via INTRAVENOUS
  Filled 2018-03-31 (×2): qty 100

## 2018-03-31 MED ORDER — METOCLOPRAMIDE HCL 5 MG/ML IJ SOLN
5.0000 mg | Freq: Four times a day (QID) | INTRAMUSCULAR | Status: DC
  Administered 2018-03-31 – 2018-04-01 (×4): 5 mg via INTRAVENOUS
  Filled 2018-03-31 (×4): qty 2

## 2018-03-31 MED ORDER — HYDROXYZINE HCL 25 MG PO TABS: 25.0000 mg | ORAL_TABLET | Freq: Three times a day (TID) | ORAL | Status: DC | PRN

## 2018-03-31 MED ORDER — PROMETHAZINE HCL 25 MG/ML IJ SOLN
12.5000 mg | Freq: Four times a day (QID) | INTRAMUSCULAR | Status: DC | PRN
  Administered 2018-03-31 – 2018-04-04 (×13): 12.5 mg via INTRAVENOUS
  Filled 2018-03-31 (×13): qty 1

## 2018-03-31 MED ORDER — HYDRALAZINE HCL 20 MG/ML IJ SOLN
10.0000 mg | Freq: Four times a day (QID) | INTRAMUSCULAR | Status: DC | PRN
  Administered 2018-03-31 – 2018-04-01 (×3): 10 mg via INTRAVENOUS
  Filled 2018-03-31 (×3): qty 1

## 2018-03-31 MED ORDER — SODIUM CHLORIDE 0.9 % IV SOLN: INTRAVENOUS | Status: DC

## 2018-03-31 MED ORDER — BUPRENORPHINE HCL-NALOXONE HCL 8-2 MG SL SUBL
1.0000 | SUBLINGUAL_TABLET | Freq: Every day | SUBLINGUAL | Status: DC
  Administered 2018-04-01 – 2018-04-04 (×4): 1 via SUBLINGUAL
  Filled 2018-03-31 (×5): qty 1

## 2018-03-31 MED ORDER — BUPRENORPHINE HCL-NALOXONE HCL 2-0.5 MG SL SUBL
2.0000 | SUBLINGUAL_TABLET | Freq: Every day | SUBLINGUAL | Status: AC
  Administered 2018-03-31: 2 via SUBLINGUAL
  Filled 2018-03-31: qty 2

## 2018-03-31 MED ORDER — SODIUM CHLORIDE 0.9 % IV SOLN
INTRAVENOUS | Status: DC
  Administered 2018-03-31: 08:00:00 via INTRAVENOUS

## 2018-03-31 MED ORDER — DEXTROSE-NACL 5-0.45 % IV SOLN
INTRAVENOUS | Status: DC
  Administered 2018-03-31 – 2018-04-01 (×3): via INTRAVENOUS

## 2018-03-31 MED ORDER — ASPIRIN EC 81 MG PO TBEC
81.0000 mg | DELAYED_RELEASE_TABLET | Freq: Every day | ORAL | Status: DC
  Administered 2018-04-02 – 2018-04-04 (×3): 81 mg via ORAL
  Filled 2018-03-31 (×3): qty 1

## 2018-03-31 MED ORDER — ONDANSETRON HCL 4 MG/2ML IJ SOLN
4.0000 mg | Freq: Once | INTRAMUSCULAR | Status: AC
  Administered 2018-03-31: 4 mg via INTRAVENOUS
  Filled 2018-03-31: qty 2

## 2018-03-31 MED ORDER — ONDANSETRON HCL 4 MG/2ML IJ SOLN
4.0000 mg | Freq: Four times a day (QID) | INTRAMUSCULAR | Status: DC
  Administered 2018-03-31 – 2018-04-04 (×17): 4 mg via INTRAVENOUS
  Filled 2018-03-31 (×17): qty 2

## 2018-03-31 NOTE — ED Triage Notes (Signed)
Pt reports n/v/d since midnight. Pt says she feels sore from vomiting.  Daughter says she woke her up ago and told her to give her some insulin but says she accidentally gave her the wrong one.  Daughter gave her 60units of lantus.

## 2018-03-31 NOTE — ED Notes (Signed)
CBG 390 

## 2018-03-31 NOTE — H&P (Signed)
History and Physical  Deanna Stokes ZOX:[id number] DOB: [birth year] DOA: 03/31/2018   PCP: Toma Deiters, MD   Patient coming from: Home  Chief Complaint: abdominal pain with n/v  HPI:  Deanna Stokes is a 47 y.o. female with medical history of diabetes mellitus, hypertension, anxiety/depression, opioid use disorder presenting with nausea and vomiting that began on the evening of 03/30/2018.  History is limited at the time of admission secondary to mild extremis.  However, according to the patient she began having nausea and vomiting on the evening of 03/30/2018 without hematemesis dysuria, hematuria, fevers, chills.  She states that she was recently admitted to [facility] present 1 month prior to this admission with similar symptoms.  She endorses compliance with her insulin.  However, she states that she is not taking Lantus for the past 4 months.  She has been switched to 70/30 insulin 60 units 3 times daily.  She denies any chest pain, headache, dysuria, hematuria.  She has some shortness of breath.  She denies any coughing or hemoptysis.  She last took her Suboxone on the morning of 03/30/2018.  She denies any recent travels or new medications.  In the emergency department, the patient was hypothermic with a temperature 95.8 F.  She was hemodynamically stable saturating 100% room air.  WBC was 13.5.  BMP showed serum glucose of 425 with anion gap of 13.  LFTs were unremarkable.  Urinalysis showed 11-20 WBC with ketonuria.  The patient will start IV fluids and IV insulin.  Assessment/Plan: DKA type II -patient started on IV insulin with q 1 hour CBG check and q 4 hour BMPs -pt started on aggressive fluid resuscitation -Electrolytes were monitored and repleted -transitioned to [place] insulin once anion gap closed -diet was advanced once anion gap closed -HbA1C--  Hypothermia -Check a.m. Cortisol -Blood cultures x2 sets -UA with 11-20 WBC  Opioid use disorder -Restart  home dose Suboxone 8/2 mg in the morning and 4/2 mg in the evening  The [facility] Controlled Substance Reporting System has been queried for this patient--pt only has one provider -UDS  Essential hypertension -Holding lisinopril, amlodipine, and clonidine onto the patient is able to tolerate po -Hydralazine as needed for SBP creatinine 180  Diabetes mellitus type 2, uncontrolled with hyperglycemia -07/15/2017 hemoglobin A1c 11.3 -Repeat hemoglobin A1c  Diabetic gastroparesis -Patient likely has exacerbation with intractable nausea and vomiting -Start Reglan 5 mg IV every 6 hours -Start around-the-clock scheduled Zofran -Phenergan as needed intractable vomiting  Hyperlipidemia -Restart Lipitor once the patient is able to tolerate po  Depression/anxiety -Restart Cymbalta, trazodone, and Atarax once the patient is able to tolerate po         Past Medical History:  Diagnosis Date  . Anxiety   . Depression   . Diabetes mellitus without complication (HCC)   . Gastroparesis   . Hypertension   . Neuropathy    Past Surgical History:  Procedure Laterality Date  . ABDOMINAL HYSTERECTOMY    . CHOLECYSTECTOMY    . TUBAL LIGATION     Social History:  reports that she has never smoked. She has never used smokeless tobacco. She reports that she drinks alcohol. She reports that she does not use drugs.   Family History--reviewed--no pertinent family hx  Allergies  Allergen Reactions  . Ibuprofen Shortness Of Breath and Rash  . Ciprofloxacin Hives  . Metformin And Related Other (See Comments)    MD told patient not to take  .  Hydrocodone-Acetaminophen Rash    Pt states she can take acetaminophen     Prior to Admission medications   Medication Sig Start Date End Date Taking? Authorizing Provider  Insulin Glargine (LANTUS SOLOSTAR) 100 UNIT/ML Solostar Pen Inject 50 Units into the skin 2 times daily at 12 noon and 4 pm. 07/16/17  Yes Vassie Loll, MD  amLODipine  (NORVASC) 5 MG tablet Take 10 mg by mouth daily.  10/10/14   [provider]  aspirin EC 81 MG tablet Take 81 mg by mouth daily.    [provider]  atorvastatin (LIPITOR) 20 MG tablet Take 20 mg by mouth daily.    [provider]  buprenorphine (SUBUTEX) 8 MG SUBL SL tablet Place 4-8 mg under the tongue 2 (two) times daily. 8MG  IN THE MORNING AND 4MG  IN THE EVENING 07/11/17   [provider]  cloNIDine (CATAPRES) 0.1 MG tablet Take 0.1 mg by mouth 2 (two) times daily.    [provider]  Continuous Blood Gluc Sensor (FREESTYLE LIBRE SENSOR SYSTEM) MISC Use one sensor every 10 days. 03/09/17   Roma Kayser, MD  DULoxetine (CYMBALTA) 60 MG capsule Take 60 mg by mouth daily.    [provider]  esomeprazole (NEXIUM) 40 MG capsule Take 1 capsule (40 mg total) by mouth 2 (two) times daily before a meal. 07/16/17   Vassie Loll, MD  estradiol (CLIMARA - DOSED IN MG/24 HR) 0.075 mg/24hr patch Place 0.075 mg onto the skin every 3 (three) days.     [provider]  furosemide (LASIX) 20 MG tablet Take 20 mg by mouth daily.     [provider]  glimepiride (AMARYL) 4 MG tablet Take 4 mg by mouth daily with breakfast.  06/27/17   [provider]  glucagon (GLUCAGON EMERGENCY) 1 MG injection Inject 1 mg into the vein once as needed. 10/26/16   Roma Kayser, MD  hydrOXYzine (ATARAX/VISTARIL) 25 MG tablet Take 25 mg by mouth 3 (three) times daily as needed for anxiety.    [provider]  lisinopril (PRINIVIL,ZESTRIL) 40 MG tablet Take 40 mg by mouth daily.    [provider]  metoCLOPramide (REGLAN) 10 MG tablet Take 1 tablet (10 mg total) by mouth 3 (three) times daily before meals for 60 doses. 07/16/17 09/11/17  Vassie Loll, MD  omega-3 acid ethyl esters (LOVAZA) 1 G capsule Take 1 g by mouth 2 (two) times daily. [facility] 10/16/14   [provider]  ondansetron (ZOFRAN ODT) 8 MG disintegrating  tablet Take 1 tablet (8 mg total) by mouth every 8 (eight) hours as needed for nausea or vomiting. Patient not taking: Reported on 09/11/2017 07/16/17   Vassie Loll, MD  promethazine (PHENERGAN) 6.25 MG/5ML syrup Take 25 mg by mouth every 6 (six) hours as needed for nausea or vomiting.    [provider]  traZODone (DESYREL) 50 MG tablet Take 50 mg by mouth at bedtime as needed for sleep.    [provider]  Vitamin D, Ergocalciferol, (DRISDOL) 50000 UNITS CAPS capsule Take 50,000 Units by mouth 2 (two) times a week. Sunday and Thursday    [provider]    Review of Systems:  Constitutional:  No weight loss, night sweats, Fevers, chills, Head&Eyes: No headache.  No vision loss ENT:  No Difficulty swallowing,Tooth/dental problems,Sore throat,  No ear ache, Cardio-vascular:  No chest pain, Orthopnea, PND, swelling in lower extremities,  dizziness, palpitations  GI:  No  abdominal pain, nausea, vomiting,loss  of appetite, hematochezia, melena, heartburn, Resp:  No cough. No coughing up of blood .No wheezing.No chest wall deformity  Skin:  no rash or lesions.  GU:  no dysuria, change in color of urine, no urgency or frequency. No flank pain.  Musculoskeletal:  Complains chronic back and leg pain Psych:  No change in mood or affect.  Neurologic: No headache, no dysesthesia, no focal weakness, no vision loss.   Physical Exam: Vitals:   03/31/18 0830 03/31/18 0900 03/31/18 0930 03/31/18 1000  BP:  (!) 160/100 (!) 156/97 (!) 155/104  Pulse: 84 72 100 (!) 107  Resp: 18 18 (!) 22 20  Temp:      TempSrc:      SpO2: 100% 100% 99% 100%  Weight:      Height:       General:  A&O x 3, NAD, nontoxic, pleasant/cooperative Head/Eye: No conjunctival hemorrhage, no icterus, [place]/AT, No nystagmus ENT:  No icterus,  No thrush, good dentition, no pharyngeal exudate Neck:  No masses, no lymphadenpathy, no bruits CV:  RRR, no rub, no gallop, no S3 Lung:  CTAB, good  air movement, no wheeze, no rhonchi Abdomen: soft/NT, +BS, nondistended, no peritoneal signs Ext: No cyanosis, No rashes, No petechiae, No lymphangitis, No edema Neuro: CNII-XII intact, strength 4/5 in bilateral upper and lower extremities, no dysmetria  Labs on Admission:  Basic Metabolic Panel: Recent Labs  Lab 03/31/18 0721  NA 135  K 3.5  CL 103  CO2 19*  GLUCOSE 425*  BUN 8  CREATININE 0.76  CALCIUM 9.4   Liver Function Tests: Recent Labs  Lab 03/31/18 0721  AST 17  ALT 17  ALKPHOS 144*  BILITOT 1.0  PROT 8.9*  ALBUMIN 4.0   Recent Labs  Lab 03/31/18 0721  LIPASE 27   No results for input(s): AMMONIA in the last 168 hours. CBC: Recent Labs  Lab 03/31/18 0721  WBC 13.5*  NEUTROABS 10.0*  HGB 12.4  HCT 38.3  MCV 83.3  PLT 538*   Coagulation Profile: No results for input(s): INR, PROTIME in the last 168 hours. Cardiac Enzymes: No results for input(s): CKTOTAL, CKMB, CKMBINDEX, TROPONINI in the last 168 hours. BNP: Invalid input(s): POCBNP CBG: Recent Labs  Lab 03/31/18 0713 03/31/18 0849 03/31/18 0953  GLUCAP 390* 380* 369*   Urine analysis:    Component Value Date/Time   COLORURINE STRAW (A) 03/31/2018 0715   APPEARANCEUR HAZY (A) 03/31/2018 0715   LABSPEC 1.029 03/31/2018 0715   PHURINE 7.0 03/31/2018 0715   GLUCOSEU >=500 (A) 03/31/2018 0715   HGBUR NEGATIVE 03/31/2018 0715   BILIRUBINUR NEGATIVE 03/31/2018 0715   KETONESUR 80 (A) 03/31/2018 0715   PROTEINUR NEGATIVE 03/31/2018 0715   NITRITE NEGATIVE 03/31/2018 0715   LEUKOCYTESUR MODERATE (A) 03/31/2018 0715   Sepsis Labs: @LABRCNTIP (procalcitonin:4,lacticidven:4) )No results found for this or any previous visit (from the past 240 hour(s)).   Radiological Exams on Admission: No results found.      Time spent:60 minutes Code Status:   FULL Family Communication:  No Family at bedside Disposition Plan: expect 1-2 day hospitalization Consults called: none DVT Prophylaxis:  [place] Lovenox  Catarina Hartshorn, DO  [facility] Pager [phone]  If 7PM-7AM, please contact night-coverage [email or web address] Password [email or web address] 03/31/2018, 10:36 AM

## 2018-03-31 NOTE — ED Provider Notes (Signed)
[facility] EMERGENCY DEPARTMENT Provider Note   CSN: [id number] Arrival date & time: 03/31/18  0705     History   Chief Complaint Chief Complaint  Patient presents with  . Emesis    HPI Deanna Stokes is a 47 y.o. female.  Patient with a history of poorly controlled diabetes.  Patient with onset of multiple episodes of vomiting starting shortly after midnight.  Associated with abdominal pain.  Patient with similar presentations here in February and April and was admitted both times.  Family members also state that she about 2 months ago was admitted at [facility].  They also state that about every month she goes to about a week of vomiting episodes.  Never formally diagnosed with gastroparesis.  Blood sugars have been running high at home.     Past Medical History:  Diagnosis Date  . Anxiety   . Depression   . Diabetes mellitus without complication (HCC)   . Gastroparesis   . Hypertension   . Neuropathy     Patient Active Problem List   Diagnosis Date Noted  . Polypharmacy 09/12/2017  . DKA (diabetic ketoacidoses) (HCC) 09/11/2017  . Acute pyelonephritis 09/11/2017  . Epigastric pain   . Diabetic gastroparesis (HCC)   . Intractable vomiting 07/14/2017  . Personal history of noncompliance with medical treatment, presenting hazards to health 03/09/2017  . Mixed hyperlipidemia 02/27/2017  . Uncontrolled type 1 diabetes mellitus with diabetic polyneuropathy (HCC) 02/27/2017  . Type II diabetes mellitus, uncontrolled (HCC) 10/14/2016  . Class 2 severe obesity due to excess calories with serious comorbidity and body mass index (BMI) of 39.0 to 39.9 in adult (HCC) 10/14/2016  . COUGH 01/08/2008  . DEPRESSION 01/07/2008  . Essential hypertension, benign 01/07/2008  . G E R D 01/07/2008    Past Surgical History:  Procedure Laterality Date  . ABDOMINAL HYSTERECTOMY    . CHOLECYSTECTOMY    . TUBAL LIGATION       OB History   None      Home Medications     Prior to Admission medications   Medication Sig Start Date End Date Taking? Authorizing Provider  Insulin Glargine (LANTUS SOLOSTAR) 100 UNIT/ML Solostar Pen Inject 50 Units into the skin 2 times daily at 12 noon and 4 pm. 07/16/17  Yes Vassie Loll, MD  amLODipine (NORVASC) 5 MG tablet Take 10 mg by mouth daily.  10/10/14   [provider]  aspirin EC 81 MG tablet Take 81 mg by mouth daily.    [provider]  atorvastatin (LIPITOR) 20 MG tablet Take 20 mg by mouth daily.    [provider]  buprenorphine (SUBUTEX) 8 MG SUBL SL tablet Place 4-8 mg under the tongue 2 (two) times daily. 8MG  IN THE MORNING AND 4MG  IN THE EVENING 07/11/17   [provider]  cloNIDine (CATAPRES) 0.1 MG tablet Take 0.1 mg by mouth 2 (two) times daily.    [provider]  Continuous Blood Gluc Sensor (FREESTYLE LIBRE SENSOR SYSTEM) MISC Use one sensor every 10 days. 03/09/17   Roma Kayser, MD  DULoxetine (CYMBALTA) 60 MG capsule Take 60 mg by mouth daily.    [provider]  esomeprazole (NEXIUM) 40 MG capsule Take 1 capsule (40 mg total) by mouth 2 (two) times daily before a meal. 07/16/17   Vassie Loll, MD  estradiol (CLIMARA - DOSED IN MG/24 HR) 0.075 mg/24hr patch Place 0.075 mg onto the skin every 3 (three) days.  [provider]  furosemide (LASIX) 20 MG tablet Take 20 mg by mouth daily.     [provider]  glimepiride (AMARYL) 4 MG tablet Take 4 mg by mouth daily with breakfast.  06/27/17   [provider]  glucagon (GLUCAGON EMERGENCY) 1 MG injection Inject 1 mg into the vein once as needed. 10/26/16   Roma Kayser, MD  hydrOXYzine (ATARAX/VISTARIL) 25 MG tablet Take 25 mg by mouth 3 (three) times daily as needed for anxiety.    [provider]  lisinopril (PRINIVIL,ZESTRIL) 40 MG tablet Take 40 mg by mouth daily.    [provider]  metoCLOPramide (REGLAN) 10 MG tablet Take 1 tablet (10 mg  total) by mouth 3 (three) times daily before meals for 60 doses. 07/16/17 09/11/17  Vassie Loll, MD  omega-3 acid ethyl esters (LOVAZA) 1 G capsule Take 1 g by mouth 2 (two) times daily. [facility] 10/16/14   [provider]  ondansetron (ZOFRAN ODT) 8 MG disintegrating tablet Take 1 tablet (8 mg total) by mouth every 8 (eight) hours as needed for nausea or vomiting. Patient not taking: Reported on 09/11/2017 07/16/17   Vassie Loll, MD  promethazine (PHENERGAN) 6.25 MG/5ML syrup Take 25 mg by mouth every 6 (six) hours as needed for nausea or vomiting.    [provider]  traZODone (DESYREL) 50 MG tablet Take 50 mg by mouth at bedtime as needed for sleep.    [provider]  Vitamin D, Ergocalciferol, (DRISDOL) 50000 UNITS CAPS capsule Take 50,000 Units by mouth 2 (two) times a week. Sunday and Thursday    [provider]    Family History No family history on file.  Social History Social History   Tobacco Use  . Smoking status: Never Smoker  . Smokeless tobacco: Never Used  Substance Use Topics  . Alcohol use: Yes    Comment: occ  . Drug use: No     Allergies   Ibuprofen; Ciprofloxacin; Metformin and related; and Hydrocodone-acetaminophen   Review of Systems Review of Systems  Constitutional: Negative for fever.  HENT: Negative for congestion.   Eyes: Negative for redness.  Respiratory: Negative for shortness of breath.   Cardiovascular: Negative for chest pain.  Gastrointestinal: Positive for abdominal pain, nausea and vomiting. Negative for blood in stool.  Genitourinary: Negative for dysuria.  Musculoskeletal: Negative for back pain.  Skin: Negative for rash.  Neurological: Negative for headaches.  Hematological: Does not bruise/bleed easily.  Psychiatric/Behavioral: Negative for confusion.     Physical Exam Updated Vital Signs BP (!) 160/100   Pulse 72   Temp (!) 95.8 F (35.4 C) (Axillary) Comment (Src): pt gagging, not able to  get orally, pt is cold to touch  Resp 18   Ht 1.702 m (5\' 7" )   Wt 108.9 kg   SpO2 100%   BMI 37.59 kg/m   Physical Exam  Constitutional: She is oriented to person, place, and time. She appears well-developed and well-nourished. She appears distressed.  HENT:  Head: Normocephalic and atraumatic.  Next membranes dry  Eyes: Pupils are equal, round, and reactive to light. Conjunctivae and EOM are normal.  Neck: Neck supple.  Cardiovascular: Normal rate, regular rhythm and normal heart sounds.  Pulmonary/Chest: Effort normal and breath sounds normal. No respiratory distress.  Abdominal: Soft. Bowel sounds are normal. There is no tenderness.  Musculoskeletal: Normal range of motion.  Neurological: She is alert and oriented to person, place, and time. No cranial nerve deficit. She exhibits  normal muscle tone. Coordination normal.  Skin: Skin is warm.  Nursing note and vitals reviewed.    ED Treatments / Results  Labs (all labs ordered are listed, but only abnormal results are displayed) Labs Reviewed  COMPREHENSIVE METABOLIC PANEL - Abnormal; Notable for the following components:      Result Value   CO2 19 (*)    Glucose, Bld 425 (*)    Total Protein 8.9 (*)    Alkaline Phosphatase 144 (*)    All other components within normal limits  URINALYSIS, ROUTINE W REFLEX MICROSCOPIC - Abnormal; Notable for the following components:   Color, Urine STRAW (*)    APPearance HAZY (*)    Glucose, UA >=500 (*)    Ketones, ur 80 (*)    Leukocytes, UA MODERATE (*)    All other components within normal limits  CBC WITH DIFFERENTIAL/PLATELET - Abnormal; Notable for the following components:   WBC 13.5 (*)    Platelets 538 (*)    Neutro Abs 10.0 (*)    Abs Immature Granulocytes 0.08 (*)    All other components within normal limits  CBG MONITORING, ED - Abnormal; Notable for the following components:   Glucose-Capillary 390 (*)    All other components within normal limits  CBG MONITORING, ED  - Abnormal; Notable for the following components:   Glucose-Capillary 380 (*)    All other components within normal limits  URINE CULTURE  LIPASE, BLOOD  CBG MONITORING, ED    EKG None  Radiology No results found.  Procedures Procedures (including critical care time)  CRITICAL CARE   CRITICAL CARE Performed by: Vanetta Mulders Total critical care time: 30 minutes Critical care time was exclusive of separately billable procedures and treating other patients. Critical care was necessary to treat or prevent imminent or life-threatening deterioration. Critical care was time spent personally by me on the following activities: development of treatment plan with patient and/or surrogate as well as nursing, discussions with consultants, evaluation of patient's response to treatment, examination of patient, obtaining history from patient or surrogate, ordering and performing treatments and interventions, ordering and review of laboratory studies, ordering and review of radiographic studies, pulse oximetry and re-evaluation of patient's condition.  Medications Ordered in ED Medications  0.9 %  sodium chloride infusion ( Intravenous New Bag/Given 03/31/18 0754)  insulin regular, human (MYXREDLIN) 100 units/ 100 mL infusion (3.2 Units/hr Intravenous New Bag/Given 03/31/18 0851)  dextrose 5 %-0.45 % sodium chloride infusion (has no administration in time range)  sodium chloride 0.9 % bolus 1,000 mL (1,000 mLs Intravenous New Bag/Given 03/31/18 0754)  ondansetron (ZOFRAN) injection 4 mg (4 mg Intravenous Given 03/31/18 0754)  promethazine (PHENERGAN) injection 12.5 mg (12.5 mg Intravenous Given 03/31/18 0754)  promethazine (PHENERGAN) injection 12.5 mg (12.5 mg Intravenous Given 03/31/18 0944)     Initial Impression / Assessment and Plan / ED Course  I have reviewed the triage vital signs and the nursing notes.  Pertinent labs & imaging results that were available during my care of the  patient were reviewed by me and considered in my medical decision making (see chart for details).    Patient with persistent vomiting.  Hyperglycemia.  May be some mild metabolic acidosis.  Started on glucose stabilizer IV insulin drip.  Patient's vomiting treated here with Phenergan x2 patient states that that usually helps some has helped a little bit.  She prefers the Phenergan over Reglan.   Patient will require admission discussed with hospitalist they will admit.  Final Clinical Impressions(s) / ED Diagnoses   Final diagnoses:  Gastroparesis  Hyperglycemia    ED Discharge Orders    None       Vanetta Mulders, MD 03/31/18 [phone]

## 2018-04-01 LAB — BASIC METABOLIC PANEL
Anion gap: 7 (ref 5–15)
Anion gap: 12 (ref 5–15)
Anion gap: 11 (ref 5–15)
BUN: 8 mg/dL (ref 6–20)
BUN: 7 mg/dL (ref 6–20)
BUN: 6 mg/dL (ref 6–20)
CHLORIDE: 108 mmol/L (ref 98–111)
CO2: 22 mmol/L (ref 22–32)
CO2: 19 mmol/L — ABNORMAL LOW (ref 22–32)
CO2: 19 mmol/L — ABNORMAL LOW (ref 22–32)
CREATININE: 0.59 mg/dL (ref 0.44–1.00)
Calcium: 8.9 mg/dL (ref 8.9–10.3)
Calcium: 9.5 mg/dL (ref 8.9–10.3)
Calcium: 9.2 mg/dL (ref 8.9–10.3)
Chloride: 104 mmol/L (ref 98–111)
Chloride: 106 mmol/L (ref 98–111)
Creatinine, Ser: 0.63 mg/dL (ref 0.44–1.00)
Creatinine, Ser: 0.64 mg/dL (ref 0.44–1.00)
GFR calc Af Amer: >60 mL/min (ref >60)
GFR calc Af Amer: >60 mL/min (ref >60)
GFR calc Af Amer: >60 mL/min (ref >60)
GFR calc non Af Amer: >60 mL/min (ref >60)
GFR calc non Af Amer: >60 mL/min (ref >60)
GLUCOSE: 158 mg/dL — AB (ref 70–99)
Glucose, Bld: 253 mg/dL — ABNORMAL HIGH (ref 70–99)
Glucose, Bld: 152 mg/dL — ABNORMAL HIGH (ref 70–99)
POTASSIUM: 3 mmol/L — AB (ref 3.5–5.1)
POTASSIUM: 3.2 mmol/L — AB (ref 3.5–5.1)
POTASSIUM: 3.2 mmol/L — AB (ref 3.5–5.1)
SODIUM: 136 mmol/L (ref 135–145)
Sodium: 137 mmol/L (ref 135–145)
Sodium: 135 mmol/L (ref 135–145)

## 2018-04-01 LAB — URINALYSIS, COMPLETE (UACMP) WITH MICROSCOPIC
Bilirubin Urine: NEGATIVE
Glucose, UA: 50 mg/dL — AB
HGB URINE DIPSTICK: NEGATIVE
Ketones, ur: 5 mg/dL — AB
Nitrite: POSITIVE — AB
PH: 5 (ref 5.0–8.0)
Protein, ur: 100 mg/dL — AB
SPECIFIC GRAVITY, URINE: 1.017 (ref 1.005–1.030)
WBC, UA: >50 WBC/hpf — ABNORMAL HIGH (ref 0–5)

## 2018-04-01 LAB — PROCALCITONIN

## 2018-04-01 LAB — CORTISOL-AM, BLOOD: CORTISOL - AM: 35.6 ug/dL — AB (ref 6.7–22.6)

## 2018-04-01 MED ORDER — METOPROLOL TARTRATE 5 MG/5ML IV SOLN
2.5000 mg | Freq: Four times a day (QID) | INTRAVENOUS | Status: DC
  Administered 2018-04-01 – 2018-04-04 (×12): 2.5 mg via INTRAVENOUS
  Filled 2018-04-01 (×13): qty 5

## 2018-04-01 MED ORDER — POTASSIUM CHLORIDE IN NACL 20-0.9 MEQ/L-% IV SOLN
INTRAVENOUS | Status: DC
  Administered 2018-04-01 – 2018-04-02 (×3): via INTRAVENOUS

## 2018-04-01 MED ORDER — ALUM & MAG HYDROXIDE-SIMETH 200-200-20 MG/5ML PO SUSP
30.0000 mL | Freq: Once | ORAL | Status: AC
  Administered 2018-04-01: 30 mL via ORAL
  Filled 2018-04-01: qty 30

## 2018-04-01 MED ORDER — ALUM & MAG HYDROXIDE-SIMETH 200-200-20 MG/5ML PO SUSP
30.0000 mL | Freq: Four times a day (QID) | ORAL | Status: DC | PRN
  Administered 2018-04-01 – 2018-04-04 (×7): 30 mL via ORAL
  Filled 2018-04-01 (×7): qty 30

## 2018-04-01 MED ORDER — HYDROXYZINE HCL 25 MG PO TABS: 25.0000 mg | ORAL_TABLET | Freq: Four times a day (QID) | ORAL | Status: DC | PRN

## 2018-04-01 MED ORDER — HYDROXYZINE HCL 50 MG/ML IM SOLN
25.0000 mg | Freq: Four times a day (QID) | INTRAMUSCULAR | Status: DC | PRN
  Administered 2018-04-01 – 2018-04-04 (×8): 25 mg via INTRAMUSCULAR
  Filled 2018-04-01 (×8): qty 1

## 2018-04-01 MED ORDER — METOCLOPRAMIDE HCL 5 MG/ML IJ SOLN
10.0000 mg | Freq: Four times a day (QID) | INTRAMUSCULAR | Status: DC
  Administered 2018-04-01 – 2018-04-04 (×12): 10 mg via INTRAVENOUS
  Filled 2018-04-01 (×13): qty 2

## 2018-04-01 MED ORDER — INSULIN ASPART 100 UNIT/ML ~~LOC~~ SOLN
0.0000 [IU] | Freq: Three times a day (TID) | SUBCUTANEOUS | Status: DC
  Administered 2018-04-02: 7 [IU] via SUBCUTANEOUS
  Administered 2018-04-02: 3 [IU] via SUBCUTANEOUS

## 2018-04-01 MED ORDER — POTASSIUM CHLORIDE 10 MEQ/100ML IV SOLN
10.0000 meq | INTRAVENOUS | Status: AC
  Administered 2018-04-01 (×4): 10 meq via INTRAVENOUS
  Filled 2018-04-01 (×4): qty 100

## 2018-04-01 MED ORDER — INSULIN GLARGINE 100 UNIT/ML ~~LOC~~ SOLN
50.0000 [IU] | Freq: Every day | SUBCUTANEOUS | Status: DC
  Administered 2018-04-01 – 2018-04-02 (×2): 50 [IU] via SUBCUTANEOUS
  Filled 2018-04-01 (×3): qty 0.5

## 2018-04-01 MED ORDER — INSULIN ASPART 100 UNIT/ML ~~LOC~~ SOLN
0.0000 [IU] | Freq: Every day | SUBCUTANEOUS | Status: DC
  Administered 2018-04-01: 2 [IU] via SUBCUTANEOUS

## 2018-04-01 NOTE — Progress Notes (Signed)
PROGRESS NOTE  Deanna Stokes UJW:[id number] DOB: Jan 09, 1971 DOA: 03/31/2018 PCP: Toma Deiters, MD  Brief History:  47 y.o. female with medical history of diabetes mellitus, hypertension, anxiety/depression, opioid use disorder presenting with nausea and vomiting that began on the evening of 03/30/2018. However, according to the patient she began having nausea and vomiting on the evening of 03/30/2018 without hematemesis dysuria, hematuria, fevers, chills.  She states that she was recently admitted to [facility] present 1 month prior to this admission with similar symptoms.  She endorses compliance with her insulin.  However, she states that she is not taking Lantus for the past 4 months.  She has been switched to 70/30 insulin 60 units 3 times daily.   She complained of subjective fevers/chills at home.  She denied headache, diarrhea, dysuria, hematochezia, melena.  She complained of abdominal pain In the emergency department, the patient was hypothermic with a temperature 95.8 F.  She was hemodynamically stable saturating 100% room air.  WBC was 13.5.  BMP showed serum glucose of 425 with anion gap of 13.  LFTs were unremarkable.  Urinalysis showed 11-20 WBC with ketonuria.   She was started on IV fluids and IV insulin  Assessment/Plan: DKA type II -patient continued on IV insulin with q 1 hour CBG check and q 4 hour BMPs -pt started on aggressive fluid resuscitation -Electrolytes were monitored and repleted -pt still dry heaving with positive anion gap metabolic acidosis 10/27 am -transitioned to [facility] insulin once anion gap closes -pt had ketonuria at time of admission  Hypothermia -Check a.m. Cortisol--pending -Blood cultures x2 sets -UA with 11-20 WBC -follow urine culture  Opioid use disorder -Restart home dose Suboxone 8/2 mg in the morning and 4/2 mg in the evening  The [facility] Controlled Substance Reporting System has been queried for this patient--pt  only has one provider -UDS--neg -holding po percocet until pt able to tolerate po -use IV morphine until able to tolerate po  Essential hypertension -Holding lisinopril, amlodipine, and clonidine onto the patient is able to tolerate po -Hydralazine as needed for SBP creatinine 180 -start IV lopressor  Diabetes mellitus type 2, uncontrolled with hyperglycemia -07/15/2017 hemoglobin A1c 11.3 -03/31/18 Repeat hemoglobin A1c--10.9  Diabetic gastroparesis -Patient likely has exacerbation with intractable nausea and vomiting -pt continues to have dry heaving and nausea -Increase Reglan 10 mg IV every 6 hours -continue around-the-clock scheduled Zofran -Phenergan as needed intractable vomiting  Hyperlipidemia -Restart Lipitor once the patient is able to tolerate po  Depression/anxiety -Restart Cymbalta, trazodone, and Atarax once the patient is able to tolerate po   Disposition Plan:   Home in 2-3 days  Family Communication:  No Family at bedside  Consultants:  none  Code Status:  FULL   DVT Prophylaxis:  [place] Lovenox   Procedures: As Listed in Progress Note Above  Antibiotics: None    Subjective: Pt continues to complain of nausea.  Still having occasional dry heaves.  No f/c, headache, cp, sob.  C/o heartburn.  Complains of pain all over.  Objective: Vitals:   04/01/18 0600 04/01/18 0700 04/01/18 0800 04/01/18 0807  BP: (!) 158/96 (!) 170/97 (!) 148/94   Pulse: (!) 109 (!) 133 (!) 110 (!) 109  Resp: (!) 22 13 12    Temp:    99.1 F (37.3 C)  TempSrc:    Oral  SpO2: 100% 100% 100% 100%  Weight:      Height:  Intake/Output Summary (Last 24 hours) at 04/01/2018 1038 Last data filed at 04/01/2018 0807 Gross per 24 hour  Intake 2333.59 ml  Output 800 ml  Net 1533.59 ml   Weight change:  Exam:   General:  Pt is alert, follows commands appropriately, not in acute distress  HEENT: No icterus, No thrush, No neck mass, [place]/AT  Cardiovascular: RRR,  S1/S2, no rubs, no gallops  Respiratory: CTA bilaterally, no wheezing, no crackles, no rhonchi  Abdomen: Soft/+BS, non tender, non distended, no guarding  Extremities: No edema, No lymphangitis, No petechiae, No rashes, no synovitis   Data Reviewed: I have personally reviewed following labs and imaging studies Basic Metabolic Panel: Recent Labs  Lab 03/31/18 1535 03/31/18 1817 03/31/18 2245 04/01/18 0245 04/01/18 0843  NA 139 138 139 137 135  K 3.7 3.6 2.9* 3.0* 3.2*  CL 108 108 111 108 104  CO2 19* 16* 19* 22 19*  GLUCOSE 217* 265* 142* 253* 152*  BUN 8 8 8 8 7   CREATININE 0.67 0.65 0.58 0.63 0.64  CALCIUM 9.4 9.4 9.3 8.9 9.5   Liver Function Tests: Recent Labs  Lab 03/31/18 0721  AST 17  ALT 17  ALKPHOS 144*  BILITOT 1.0  PROT 8.9*  ALBUMIN 4.0   Recent Labs  Lab 03/31/18 0721  LIPASE 27   No results for input(s): AMMONIA in the last 168 hours. Coagulation Profile: No results for input(s): INR, PROTIME in the last 168 hours. CBC: Recent Labs  Lab 03/31/18 0721  WBC 13.5*  NEUTROABS 10.0*  HGB 12.4  HCT 38.3  MCV 83.3  PLT 538*   Cardiac Enzymes: No results for input(s): CKTOTAL, CKMB, CKMBINDEX, TROPONINI in the last 168 hours. BNP: Invalid input(s): POCBNP CBG: Recent Labs  Lab 03/31/18 0713 03/31/18 0849 03/31/18 0953 03/31/18 1047  GLUCAP 390* 380* 369* 340*   HbA1C: Recent Labs    03/31/18 0731  HGBA1C 10.9*   Urine analysis:    Component Value Date/Time   COLORURINE STRAW (A) 03/31/2018 0715   APPEARANCEUR HAZY (A) 03/31/2018 0715   LABSPEC 1.029 03/31/2018 0715   PHURINE 7.0 03/31/2018 0715   GLUCOSEU >=500 (A) 03/31/2018 0715   HGBUR NEGATIVE 03/31/2018 0715   BILIRUBINUR NEGATIVE 03/31/2018 0715   KETONESUR 80 (A) 03/31/2018 0715   PROTEINUR NEGATIVE 03/31/2018 0715   NITRITE NEGATIVE 03/31/2018 0715   LEUKOCYTESUR MODERATE (A) 03/31/2018 0715   Sepsis Labs: @LABRCNTIP (procalcitonin:4,lacticidven:4) ) Recent  Results (from the past 240 hour(s))  MRSA PCR Screening     Status: None   Collection Time: 03/31/18 11:53 AM  Result Value Ref Range Status   MRSA by PCR NEGATIVE NEGATIVE Final    Comment:        The GeneXpert MRSA Assay (FDA approved for NASAL specimens only), is one component of a comprehensive MRSA colonization surveillance program. It is not intended to diagnose MRSA infection nor to guide or monitor treatment for MRSA infections. Performed at [facility], [address] [place], [place] [address]   Culture, blood (Routine X 2) w Reflex to ID Panel     Status: None (Preliminary result)   Collection Time: 03/31/18  3:47 PM  Result Value Ref Range Status   Specimen Description BLOOD LEFT HAND  Final   Special Requests   Final    BOTTLES DRAWN AEROBIC AND ANAEROBIC Blood Culture adequate volume   Culture   Final    NO GROWTH < 24 HOURS Performed at [facility], [address], [place], [place]  [address]    Report Status PENDING  Incomplete  Culture, blood (Routine X 2) w Reflex to ID Panel     Status: None (Preliminary result)   Collection Time: 03/31/18  6:17 PM  Result Value Ref Range Status   Specimen Description BLOOD LEFT HAND  Final   Special Requests   Final    BOTTLES DRAWN AEROBIC ONLY Blood Culture results may not be optimal due to an inadequate volume of blood received in culture bottles   Culture   Final    NO GROWTH < 12 HOURS Performed at [facility], [address], [place], [place] [address]    Report Status PENDING  Incomplete     Scheduled Meds: . alum & mag hydroxide-simeth  30 mL Oral Once  . aspirin EC  81 mg Oral Daily  . buprenorphine-naloxone  1 tablet Sublingual Daily  . DULoxetine  60 mg Oral Daily  . enoxaparin (LOVENOX) injection  50 mg Subcutaneous Q24H  . estradiol  0.075 mg Transdermal Q3 days  . metoCLOPramide (REGLAN) injection  10 mg Intravenous Q6H  . ondansetron (ZOFRAN) IV  4 mg Intravenous Q6H   Continuous Infusions: .  sodium chloride    . dextrose 5 % and 0.45% NaCl 125 mL/hr at 04/01/18 0556  . famotidine (PEPCID) IV    . insulin 5.9 Units/hr (04/01/18 0806)  . potassium chloride      Procedures/Studies: No results found.  Catarina Hartshorn, DO  [facility] Pager [phone]  If 7PM-7AM, please contact night-coverage [email or web address] Password [email or web address] 04/01/2018, 10:38 AM   LOS: 1 day

## 2018-04-01 NOTE — Progress Notes (Signed)
Still trying to vomit and now has started to spit up some blood tinged sputum. Has several different IV medications which are not compatible with Insulin such as pepcid and is being held. Attempted with 3 different providers to place another IV catheter.

## 2018-04-02 ENCOUNTER — Inpatient Hospital Stay (HOSPITAL_COMMUNITY): Payer: Medicare HMO

## 2018-04-02 DIAGNOSIS — R739 Hyperglycemia, unspecified: Secondary | ICD-10-CM

## 2018-04-02 LAB — BASIC METABOLIC PANEL
Anion gap: 13 (ref 5–15)
Anion gap: 9 (ref 5–15)
Anion gap: 10 (ref 5–15)
BUN: 9 mg/dL (ref 6–20)
BUN: 7 mg/dL (ref 6–20)
BUN: 7 mg/dL (ref 6–20)
CHLORIDE: 104 mmol/L (ref 98–111)
CO2: 17 mmol/L — AB (ref 22–32)
CO2: 21 mmol/L — ABNORMAL LOW (ref 22–32)
CO2: 21 mmol/L — AB (ref 22–32)
CREATININE: 0.62 mg/dL (ref 0.44–1.00)
CREATININE: 0.57 mg/dL (ref 0.44–1.00)
CREATININE: 0.5 mg/dL (ref 0.44–1.00)
Calcium: 8.8 mg/dL — ABNORMAL LOW (ref 8.9–10.3)
Calcium: 8.5 mg/dL — ABNORMAL LOW (ref 8.9–10.3)
Calcium: 8.6 mg/dL — ABNORMAL LOW (ref 8.9–10.3)
Chloride: 104 mmol/L (ref 98–111)
Chloride: 104 mmol/L (ref 98–111)
GFR calc Af Amer: >60 mL/min (ref >60)
GFR calc Af Amer: >60 mL/min (ref >60)
GFR calc Af Amer: >60 mL/min (ref >60)
GFR calc non Af Amer: >60 mL/min (ref >60)
GFR calc non Af Amer: >60 mL/min (ref >60)
GFR calc non Af Amer: >60 mL/min (ref >60)
GLUCOSE: 104 mg/dL — AB (ref 70–99)
GLUCOSE: 114 mg/dL — AB (ref 70–99)
Glucose, Bld: 211 mg/dL — ABNORMAL HIGH (ref 70–99)
POTASSIUM: 2.9 mmol/L — AB (ref 3.5–5.1)
Potassium: 3.5 mmol/L (ref 3.5–5.1)
Potassium: 3.1 mmol/L — ABNORMAL LOW (ref 3.5–5.1)
SODIUM: 134 mmol/L — AB (ref 135–145)
SODIUM: 134 mmol/L — AB (ref 135–145)
Sodium: 135 mmol/L (ref 135–145)

## 2018-04-02 LAB — URINE CULTURE

## 2018-04-02 LAB — CBC
HEMATOCRIT: 35.2 % — AB (ref 36.0–46.0)
HEMOGLOBIN: 11.4 g/dL — AB (ref 12.0–15.0)
MCH: 27.9 pg (ref 26.0–34.0)
MCHC: 32.4 g/dL (ref 30.0–36.0)
MCV: 86.1 fL (ref 80.0–100.0)
Platelets: 457 10*3/uL — ABNORMAL HIGH (ref 150–400)
RBC: 4.09 MIL/uL (ref 3.87–5.11)
RDW: 13.7 % (ref 11.5–15.5)
WBC: 18.6 10*3/uL — ABNORMAL HIGH (ref 4.0–10.5)
nRBC: 0 % (ref 0.0–0.2)

## 2018-04-02 LAB — URINALYSIS, COMPLETE (UACMP) WITH MICROSCOPIC
BILIRUBIN URINE: NEGATIVE
Glucose, UA: NEGATIVE mg/dL
HGB URINE DIPSTICK: NEGATIVE
Ketones, ur: 20 mg/dL — AB
Nitrite: NEGATIVE
PROTEIN: NEGATIVE mg/dL
Specific Gravity, Urine: 1.013 (ref 1.005–1.030)
pH: 6 (ref 5.0–8.0)

## 2018-04-02 LAB — PROCALCITONIN: Procalcitonin: <0.1 ng/mL

## 2018-04-02 LAB — MAGNESIUM: Magnesium: 1.6 mg/dL — ABNORMAL LOW (ref 1.7–2.4)

## 2018-04-02 MED ORDER — IOPAMIDOL (ISOVUE-300) INJECTION 61%
100.0000 mL | Freq: Once | INTRAVENOUS | Status: AC | PRN
  Administered 2018-04-02: 100 mL via INTRAVENOUS

## 2018-04-02 MED ORDER — DEXTROSE-NACL 5-0.45 % IV SOLN: INTRAVENOUS | Status: DC

## 2018-04-02 MED ORDER — INSULIN REGULAR(HUMAN) IN NACL 100-0.9 UT/100ML-% IV SOLN
INTRAVENOUS | Status: DC
  Filled 2018-04-02: qty 100

## 2018-04-02 MED ORDER — CHLORHEXIDINE GLUCONATE CLOTH 2 % EX PADS
6.0000 | MEDICATED_PAD | Freq: Every day | CUTANEOUS | Status: DC
  Administered 2018-04-02 – 2018-04-04 (×3): 6 via TOPICAL

## 2018-04-02 MED ORDER — INSULIN GLARGINE 100 UNIT/ML ~~LOC~~ SOLN
55.0000 [IU] | Freq: Every day | SUBCUTANEOUS | Status: DC
  Administered 2018-04-03: 55 [IU] via SUBCUTANEOUS
  Filled 2018-04-02 (×2): qty 0.55

## 2018-04-02 MED ORDER — IOPAMIDOL (ISOVUE-300) INJECTION 61%: 50.0000 mL | Freq: Once | INTRAVENOUS | Status: DC | PRN

## 2018-04-02 MED ORDER — SODIUM CHLORIDE 0.9% FLUSH
10.0000 mL | Freq: Two times a day (BID) | INTRAVENOUS | Status: DC
  Administered 2018-04-02 – 2018-04-04 (×5): 10 mL

## 2018-04-02 MED ORDER — SODIUM CHLORIDE 0.9 % IV SOLN: INTRAVENOUS | Status: DC

## 2018-04-02 MED ORDER — INSULIN ASPART 100 UNIT/ML ~~LOC~~ SOLN
0.0000 [IU] | Freq: Every day | SUBCUTANEOUS | Status: DC
  Administered 2018-04-03: 2 [IU] via SUBCUTANEOUS

## 2018-04-02 MED ORDER — INSULIN ASPART 100 UNIT/ML ~~LOC~~ SOLN
0.0000 [IU] | Freq: Three times a day (TID) | SUBCUTANEOUS | Status: DC
  Administered 2018-04-04: 3 [IU] via SUBCUTANEOUS

## 2018-04-02 MED ORDER — SODIUM CHLORIDE 0.9% FLUSH: 10.0000 mL | INTRAVENOUS | Status: DC | PRN

## 2018-04-02 MED ORDER — SUCRALFATE 1 GM/10ML PO SUSP
1.0000 g | Freq: Once | ORAL | Status: AC
  Administered 2018-04-02: 1 g via ORAL
  Filled 2018-04-02: qty 10

## 2018-04-02 MED ORDER — POTASSIUM CHLORIDE IN NACL 20-0.9 MEQ/L-% IV SOLN
INTRAVENOUS | Status: DC
  Administered 2018-04-02 – 2018-04-03 (×3): via INTRAVENOUS

## 2018-04-02 MED ORDER — POTASSIUM CHLORIDE 10 MEQ/100ML IV SOLN
10.0000 meq | INTRAVENOUS | Status: AC
  Administered 2018-04-02 (×2): 10 meq via INTRAVENOUS
  Filled 2018-04-02 (×2): qty 100

## 2018-04-02 MED ORDER — SODIUM CHLORIDE 0.9 % IV SOLN
INTRAVENOUS | Status: DC
  Administered 2018-04-02: 18:00:00 via INTRAVENOUS

## 2018-04-02 MED ORDER — SODIUM CHLORIDE 0.9 % IV SOLN
1.0000 g | INTRAVENOUS | Status: DC
  Administered 2018-04-02 – 2018-04-03 (×2): 1 g via INTRAVENOUS
  Filled 2018-04-02: qty 1
  Filled 2018-04-02 (×2): qty 10

## 2018-04-02 MED ORDER — MORPHINE SULFATE (PF) 2 MG/ML IV SOLN
1.0000 mg | INTRAVENOUS | Status: DC | PRN
  Administered 2018-04-02 – 2018-04-03 (×3): 1 mg via INTRAVENOUS
  Filled 2018-04-02 (×3): qty 1

## 2018-04-02 NOTE — Progress Notes (Signed)
Dr. Arbutus Leas made aware of recent lab results including potassium level of 2.9

## 2018-04-02 NOTE — Progress Notes (Addendum)
Inpatient Diabetes Program Recommendations  AACE/ADA: New Consensus Statement on Inpatient Glycemic Control (2019)  Target Ranges:  Prepandial:   less than 140 mg/dL      Peak postprandial:   less than 180 mg/dL (1-2 hours)      Critically ill patients:  140 - 180 mg/dL   Results for Deanna Stokes, Deanna Stokes (MRN [id number]) as of 04/02/2018 09:10  Ref. Range 04/01/2018 08:43 04/01/2018 12:31 04/02/2018 04:41  Glucose Latest Ref Range: 70 - 99 mg/dL 409 (H) 811 (H) 914 (H)   Results for Deanna Stokes, Deanna Stokes (MRN [id number]) as of 04/02/2018 09:10  Ref. Range 07/15/2017 00:25 03/31/2018 07:31  Hemoglobin A1C Latest Ref Range: 4.8 - 5.6 % 11.3 (H) 10.9 (H)    Review of Glycemic Control  Diabetes history: DM2 Outpatient Diabetes medications: 70/30 60 units TID with meals, Amaryl 4 mg QAM Current orders for Inpatient glycemic control: Lantus 50 units daily, Novolog 0-20 units TID with meals, Novolog 0-5 units QHS  Inpatient Diabetes Program Recommendations: Insulin - Basal: Fasting glucose 205 mg/dl this morning. Please consider increasing Lantus to 55 units daily. HbgA1C:  A1C 10.9% on 03/31/18 indicating an average glucose of 266 mg/dl to evaluate glycemic control over the past 2-3 months.In reviewing chart, noted Diabetes Coordinator spoke with patient on 09/12/17 and patient reported having hard time affording medications despite having Medicare. Will plan to speak with patient regarding A1C.  Addendum 04/02/18@15 :15-Spoke with patient about diabetes and home regimen for diabetes control. Patient reports that she is followed by PCP for diabetes management and currently she takes 70/30 60 units TID with meals and Amaryl 4 mg QAM as an outpatient for diabetes control. Patient reports that she is taking insulin as prescribed. Patient had 2 episodes of vomiting (heaving with clear fluid noted) during conversation but notes that nurse has already given her medication for nausea. Patient states that she has  everything she needs at home for DM control. Inquired about ability to pay copays of medications and patient states that she was having a problem with it but since it is the end of the year she is able to get medications at this time. Encouraged patient to check with Medicare and Medicaid if she has an issues with copay cost so they can see if she qualifies for additional assistance with medications.  Patient states that she checks her glucose 2-3 times per day and it is "up and down". Inquired about ranges and patient states she has lows about once a week and generally her glucose is in the 100's mg/dl in the morning and goes up in the day especially if she eats something she shouldn't.   Discussed A1C results (10.9%on 03/31/18) and explained that her current A1C indicates an average glucose of 266 mg/dl over the past 2-3 months. Discussed glucose and A1C goals. Discussed importance of checking CBGs and maintaining good CBG control to prevent long-term and short-term complications. Explained how hyperglycemia leads to damage within blood vessels which lead to the common complications seen with uncontrolled diabetes. Stressed to the patient the importance of improving glycemic control to prevent further complications from uncontrolled diabetes. Discussed impact of nutrition, exercise, stress, sickness, and medications on diabetes control.Patient states that she did not take insulin over the past couple of days because she felt so poorly. Discussed sick day rules and importance of checking glucose and taking insulin.  Encouraged patient to check her glucose 4 times per day (before meals and at bedtime and more often when sick) and  to keep a log book of glucose readings and insulin taken which she will need to take to doctor appointments. Patient verbalized understanding of information discussed and she states that she has no further questions at this time related to diabetes. Informed Victorino Dike, RN that patient has 2  episodes of vomiting (heaving with clear fluid noted) during conversation.  Thanks, Orlando Penner, RN, MSN, CDE Diabetes Coordinator Inpatient Diabetes Program [phone] (Team Pager from 8am to 5pm)

## 2018-04-02 NOTE — Progress Notes (Signed)
Peripherally Inserted Central Catheter/Midline Placement  The IV Nurse has discussed with the patient and/or persons authorized to consent for the patient, the purpose of this procedure and the potential benefits and risks involved with this procedure.  The benefits include less needle sticks, lab draws from the catheter, and the patient may be discharged home with the catheter. Risks include, but not limited to, infection, bleeding, blood clot (thrombus formation), and puncture of an artery; nerve damage and irregular heartbeat and possibility to perform a PICC exchange if needed/ordered by physician.  Alternatives to this procedure were also discussed.  Bard Power PICC patient education guide, fact sheet on infection prevention and patient information card has been provided to patient /or left at bedside.    PICC/Midline Placement Documentation  PICC Double Lumen 04/02/18 PICC Right Cephalic 42 cm 3 cm (Active)  Indication for Insertion or Continuance of Line Poor Vasculature-patient has had multiple peripheral attempts or PIVs lasting less than 24 hours 04/02/2018 11:00 AM  Exposed Catheter (cm) 3 cm 04/02/2018 11:00 AM  Site Assessment Clean;Dry;Intact 04/02/2018 11:00 AM  Lumen #1 Status Flushed;Blood return noted 04/02/2018 11:00 AM  Lumen #2 Status Flushed;Blood return noted 04/02/2018 11:00 AM  Dressing Type Transparent 04/02/2018 11:00 AM  Dressing Status Clean;Dry;Intact;Antimicrobial disc in place 04/02/2018 11:00 AM  Dressing Change Due 04/09/18 04/02/2018 11:00 AM       Stacie Glaze Horton 04/02/2018, 11:33 AM

## 2018-04-02 NOTE — Progress Notes (Signed)
PROGRESS NOTE  Deanna Stokes:[id number] DOB: Jan 03, 1971 DOA: 03/31/2018 PCP: Toma Deiters, MD  Brief History:  47 y.o.femalewith medical history ofdiabetes mellitus, hypertension, anxiety/depression, opioid use disorder presenting with nausea and vomiting that began on the evening of 03/30/2018. However, according to the patient she began having nausea and vomiting on the evening of 03/30/2018 without hematemesis dysuria, hematuria, fevers, chills. She states that she was recently admitted to [facility] present 1 month prior to this admission with similar symptoms. She endorses compliance with her insulin. However, she states that she is not taking Lantus for the past 4 months. She has been switched to 70/30 insulin 60 units 3 times daily.   She complained of subjective fevers/chills at home.  She denied headache, diarrhea, dysuria, hematochezia, melena.  She complained of abdominal pain In the emergency department, the patient was hypothermic with a temperature 95.8 F. She was hemodynamically stable saturating 100% room air. WBC was 13.5. BMP showed serum glucose of 425 with anion gap of 13. LFTs were unremarkable. Urinalysis showed 11-20 WBC with ketonuria.   She was started on IV fluids and IV insulin.  Unfortunately, her recovery was slow due in part from the patient's gastropasresis.  Assessment/Plan: DKA type II -patient continued on IV insulin with q 1 hour CBG check and q 4 hour BMPs -pt started on aggressive fluid resuscitation -Electrolytes were monitored and repleted -transitioned to [place] insulin once anion gap closes -pt still had ketonuria 10/27  Hypothermia -Check a.m. Cortisol--35.6 -Blood cultures x2 sets--neg -UA with 11-20 WBC -follow urine culture--multiple organisms -10/27 UA >50 WBC-->repeat urine culture  Opioid use disorder -Restart home dose Suboxone 8/2 mg in the morning and 4/2 mg in the evening [facility]  Controlled Substance Reporting System has been queried for this patient--pt only has one provider -UDS--neg -holding po percocet until pt able to tolerate po -use IV morphine until able to tolerate po  Essential hypertension -Holding lisinopril, amlodipine, and clonidine onto the patient is able to toleratepo -Hydralazine as needed for SBP creatinine 180 -start IV lopressor  Diabetes mellitus type 2, uncontrolled with hyperglycemia -07/15/2017 hemoglobin A1c 11.3 -03/31/18 Repeat hemoglobin A1c--10.9 -increase lantus to 55 units -resistant ISS  Diabetic gastroparesis--intractable N/V -Patient likely has exacerbation with intractable nausea and vomiting -pt continues to have dry heaving and nausea -Increased Reglan 10 mg IV every 6 hours -continue around-the-clock scheduled Zofran -Phenergan as needed intractable vomiting -decrease morphine IV -CT abd/pelvis  Hyperlipidemia -Restart Lipitor once the patient is able to toleratepo  Depression/anxiety -Restart Cymbalta, trazodone, and Atarax once the patient is able to toleratepo   Disposition Plan:   Home in 2-3 days  Family Communication:  No Family at bedside  Consultants:  none  Code Status:  FULL   DVT Prophylaxis:  [place] Lovenox   Procedures: As Listed in Progress Note Above  Antibiotics: None     Subjective: Pt continues to complain of dry heaves and abd pain.  No diarrhea.  No cp, sob, f/c, headache  Objective: Vitals:   04/02/18 1414 04/02/18 1500 04/02/18 1600 04/02/18 1626  BP: (!) 155/87 (!) 151/88 (!) 160/88   Pulse: 99 100 96   Resp: 19 (!) 24 (!) 23   Temp:    98.5 F (36.9 C)  TempSrc:    Oral  SpO2: 99% 99% 100%   Weight:      Height:        Intake/Output Summary (  Last 24 hours) at 04/02/2018 1821 Last data filed at 04/02/2018 1804 Gross per 24 hour  Intake 2674.35 ml  Output -  Net 2674.35 ml   Weight change: -0.663 kg Exam:   General:  Pt is alert, follows  commands appropriately, not in acute distress  HEENT: No icterus, No thrush, No neck mass, /AT  Cardiovascular: RRR, S1/S2, no rubs, no gallops  Respiratory: CTA bilaterally, no wheezing, no crackles, no rhonchi  Abdomen: Soft/+BS, non tender, non distended, no guarding  Extremities: No edema, No lymphangitis, No petechiae, No rashes, no synovitis   Data Reviewed: I have personally reviewed following labs and imaging studies Basic Metabolic Panel: Recent Labs  Lab 04/01/18 0245 04/01/18 0843 04/01/18 1231 04/02/18 0441 04/02/18 1718  NA 137 135 136 134* 134*  K 3.0* 3.2* 3.2* 3.5 2.9*  CL 108 104 106 104 104  CO2 22 19* 19* 17* 21*  GLUCOSE 253* 152* 158* 211* 104*  BUN 8 7 6 9 7   CREATININE 0.63 0.64 0.59 0.62 0.57  CALCIUM 8.9 9.5 9.2 8.8* 8.5*  MG  --   --   --  1.6*  --    Liver Function Tests: Recent Labs  Lab 03/31/18 0721  AST 17  ALT 17  ALKPHOS 144*  BILITOT 1.0  PROT 8.9*  ALBUMIN 4.0   Recent Labs  Lab 03/31/18 0721  LIPASE 27   No results for input(s): AMMONIA in the last 168 hours. Coagulation Profile: No results for input(s): INR, PROTIME in the last 168 hours. CBC: Recent Labs  Lab 03/31/18 0721 04/02/18 0441  WBC 13.5* 18.6*  NEUTROABS 10.0*  --   HGB 12.4 11.4*  HCT 38.3 35.2*  MCV 83.3 86.1  PLT 538* 457*   Cardiac Enzymes: No results for input(s): CKTOTAL, CKMB, CKMBINDEX, TROPONINI in the last 168 hours. BNP: Invalid input(s): POCBNP CBG: Recent Labs  Lab 03/31/18 0713 03/31/18 0849 03/31/18 0953 03/31/18 1047  GLUCAP 390* 380* 369* 340*   HbA1C: Recent Labs    03/31/18 0731  HGBA1C 10.9*   Urine analysis:    Component Value Date/Time   COLORURINE YELLOW 04/01/2018 1622   APPEARANCEUR CLOUDY (A) 04/01/2018 1622   LABSPEC 1.017 04/01/2018 1622   PHURINE 5.0 04/01/2018 1622   GLUCOSEU 50 (A) 04/01/2018 1622   HGBUR NEGATIVE 04/01/2018 1622   BILIRUBINUR NEGATIVE 04/01/2018 1622   KETONESUR 5 (A)  04/01/2018 1622   PROTEINUR 100 (A) 04/01/2018 1622   NITRITE POSITIVE (A) 04/01/2018 1622   LEUKOCYTESUR LARGE (A) 04/01/2018 1622   Sepsis Labs: @LABRCNTIP (procalcitonin:4,lacticidven:4) ) Recent Results (from the past 240 hour(s))  Urine Culture     Status: Abnormal   Collection Time: 03/31/18  9:27 AM  Result Value Ref Range Status   Specimen Description   Final    URINE, CLEAN CATCH Performed at [facility], [address], [place], [place] [address]    Special Requests   Final    NONE Performed at [facility], [address] [place], [place] [address]    Culture MULTIPLE SPECIES PRESENT, SUGGEST RECOLLECTION (A)  Final   Report Status 04/02/2018 FINAL  Final  MRSA PCR Screening     Status: None   Collection Time: 03/31/18 11:53 AM  Result Value Ref Range Status   MRSA by PCR NEGATIVE NEGATIVE Final    Comment:        The GeneXpert MRSA Assay (FDA approved for NASAL specimens only), is one component of a comprehensive MRSA colonization surveillance program.  It is not intended to diagnose MRSA infection nor to guide or monitor treatment for MRSA infections. Performed at [facility], [address], [place], [place] [address]   Culture, blood (Routine X 2) w Reflex to ID Panel     Status: None (Preliminary result)   Collection Time: 03/31/18  3:47 PM  Result Value Ref Range Status   Specimen Description BLOOD LEFT HAND  Final   Special Requests   Final    BOTTLES DRAWN AEROBIC AND ANAEROBIC Blood Culture adequate volume   Culture   Final    NO GROWTH 2 DAYS Performed at [facility], [address] [place], [place] [address]    Report Status PENDING  Incomplete  Culture, blood (Routine X 2) w Reflex to ID Panel     Status: None (Preliminary result)   Collection Time: 03/31/18  6:17 PM  Result Value Ref Range Status   Specimen Description BLOOD LEFT HAND  Final   Special Requests   Final    BOTTLES DRAWN AEROBIC ONLY Blood Culture results may not be optimal  due to an inadequate volume of blood received in culture bottles   Culture   Final    NO GROWTH 2 DAYS Performed at [facility], [address], [place], [place] [address]    Report Status PENDING  Incomplete     Scheduled Meds: . aspirin EC  81 mg Oral Daily  . buprenorphine-naloxone  1 tablet Sublingual Daily  . Chlorhexidine Gluconate Cloth  6 each Topical Daily  . DULoxetine  60 mg Oral Daily  . enoxaparin (LOVENOX) injection  50 mg Subcutaneous Q24H  . estradiol  0.075 mg Transdermal Q3 days  . [START ON 04/03/2018] insulin aspart  0-20 Units Subcutaneous TID WC  . insulin aspart  0-5 Units Subcutaneous QHS  . metoCLOPramide (REGLAN) injection  10 mg Intravenous Q6H  . metoprolol tartrate  2.5 mg Intravenous Q6H  . ondansetron (ZOFRAN) IV  4 mg Intravenous Q6H  . sodium chloride flush  10-40 mL Intracatheter Q12H  . sucralfate  1 g Oral Once   Continuous Infusions: . 0.9 % NaCl with KCl 20 mEq / L    . famotidine (PEPCID) IV Stopped (04/02/18 0941)  . potassium chloride 100 mL/hr at 04/02/18 1804    Procedures/Studies: No results found.  Catarina Hartshorn, DO  [facility] Pager [phone]  If 7PM-7AM, please contact night-coverage [email or web address] Password [email or web address] 04/02/2018, 6:21 PM   LOS: 2 days

## 2018-04-02 NOTE — Care Management Important Message (Signed)
Important Message  Patient Details  Name: Deanna Stokes MRN: [id number] Date of Birth: [birth year]   Medicare Important Message Given:       Renie Ora 04/02/2018, 12:35 PM

## 2018-04-02 NOTE — Care Management Note (Signed)
Case Management Note  Patient Details  Name: Deanna Stokes MRN: [id number] Date of Birth: [birth year]  Subjective/Objective:    DKA. From home. Independent. CM consulted for safe injection, needle exchange program. Discussed in progression rounds with MD. Consult not needed, it is a part of chronic opoid use order set and is on suboxone. Patient has insurance with prescription coverage.               Action/Plan: DC home. No CM needs.   Expected Discharge Date:    unknown              Expected Discharge Plan:     In-House Referral:     Discharge planning Services     Post Acute Care Choice:    Choice offered to:     DME Arranged:    DME Agency:     HH Arranged:    HH Agency:     Status of Service:     If discussed at [employer], dates discussed:    Additional Comments:  [name], Chrystine Oiler, RN 04/02/2018, 11:58 AM

## 2018-04-03 DIAGNOSIS — K3184 Gastroparesis: Secondary | ICD-10-CM

## 2018-04-03 LAB — BASIC METABOLIC PANEL
ANION GAP: 7 (ref 5–15)
Anion gap: 9 (ref 5–15)
Anion gap: 10 (ref 5–15)
Anion gap: 9 (ref 5–15)
BUN: 7 mg/dL (ref 6–20)
BUN: 7 mg/dL (ref 6–20)
BUN: 7 mg/dL (ref 6–20)
BUN: 7 mg/dL (ref 6–20)
CALCIUM: 8.5 mg/dL — AB (ref 8.9–10.3)
CALCIUM: 8.5 mg/dL — AB (ref 8.9–10.3)
CHLORIDE: 104 mmol/L (ref 98–111)
CHLORIDE: 104 mmol/L (ref 98–111)
CO2: 22 mmol/L (ref 22–32)
CO2: 22 mmol/L (ref 22–32)
CO2: 23 mmol/L (ref 22–32)
CO2: 25 mmol/L (ref 22–32)
CREATININE: 0.55 mg/dL (ref 0.44–1.00)
Calcium: 8.6 mg/dL — ABNORMAL LOW (ref 8.9–10.3)
Calcium: 8.5 mg/dL — ABNORMAL LOW (ref 8.9–10.3)
Chloride: 103 mmol/L (ref 98–111)
Chloride: 104 mmol/L (ref 98–111)
Creatinine, Ser: 0.57 mg/dL (ref 0.44–1.00)
Creatinine, Ser: 0.56 mg/dL (ref 0.44–1.00)
Creatinine, Ser: 0.5 mg/dL (ref 0.44–1.00)
GFR calc Af Amer: >60 mL/min (ref >60)
GFR calc Af Amer: >60 mL/min (ref >60)
GFR calc non Af Amer: >60 mL/min (ref >60)
GFR calc non Af Amer: >60 mL/min (ref >60)
GLUCOSE: 134 mg/dL — AB (ref 70–99)
Glucose, Bld: 122 mg/dL — ABNORMAL HIGH (ref 70–99)
Glucose, Bld: 120 mg/dL — ABNORMAL HIGH (ref 70–99)
Glucose, Bld: 113 mg/dL — ABNORMAL HIGH (ref 70–99)
POTASSIUM: 3 mmol/L — AB (ref 3.5–5.1)
POTASSIUM: 2.9 mmol/L — AB (ref 3.5–5.1)
Potassium: 2.9 mmol/L — ABNORMAL LOW (ref 3.5–5.1)
Potassium: 3.1 mmol/L — ABNORMAL LOW (ref 3.5–5.1)
SODIUM: 136 mmol/L (ref 135–145)
Sodium: 134 mmol/L — ABNORMAL LOW (ref 135–145)
Sodium: 136 mmol/L (ref 135–145)
Sodium: 136 mmol/L (ref 135–145)

## 2018-04-03 LAB — GLUCOSE, CAPILLARY
GLUCOSE-CAPILLARY: 105 mg/dL — AB (ref 70–99)
GLUCOSE-CAPILLARY: 105 mg/dL — AB (ref 70–99)
GLUCOSE-CAPILLARY: 113 mg/dL — AB (ref 70–99)
GLUCOSE-CAPILLARY: 126 mg/dL — AB (ref 70–99)
GLUCOSE-CAPILLARY: 150 mg/dL — AB (ref 70–99)
GLUCOSE-CAPILLARY: 150 mg/dL — AB (ref 70–99)
GLUCOSE-CAPILLARY: 156 mg/dL — AB (ref 70–99)
GLUCOSE-CAPILLARY: 159 mg/dL — AB (ref 70–99)
GLUCOSE-CAPILLARY: 160 mg/dL — AB (ref 70–99)
GLUCOSE-CAPILLARY: 168 mg/dL — AB (ref 70–99)
GLUCOSE-CAPILLARY: 206 mg/dL — AB (ref 70–99)
GLUCOSE-CAPILLARY: 207 mg/dL — AB (ref 70–99)
GLUCOSE-CAPILLARY: 211 mg/dL — AB (ref 70–99)
GLUCOSE-CAPILLARY: 223 mg/dL — AB (ref 70–99)
GLUCOSE-CAPILLARY: 228 mg/dL — AB (ref 70–99)
GLUCOSE-CAPILLARY: 262 mg/dL — AB (ref 70–99)
GLUCOSE-CAPILLARY: 265 mg/dL — AB (ref 70–99)
GLUCOSE-CAPILLARY: 92 mg/dL (ref 70–99)
Glucose-Capillary: 103 mg/dL — ABNORMAL HIGH (ref 70–99)
Glucose-Capillary: 128 mg/dL — ABNORMAL HIGH (ref 70–99)
Glucose-Capillary: 136 mg/dL — ABNORMAL HIGH (ref 70–99)
Glucose-Capillary: 140 mg/dL — ABNORMAL HIGH (ref 70–99)
Glucose-Capillary: 143 mg/dL — ABNORMAL HIGH (ref 70–99)
Glucose-Capillary: 146 mg/dL — ABNORMAL HIGH (ref 70–99)
Glucose-Capillary: 151 mg/dL — ABNORMAL HIGH (ref 70–99)
Glucose-Capillary: 187 mg/dL — ABNORMAL HIGH (ref 70–99)
Glucose-Capillary: 205 mg/dL — ABNORMAL HIGH (ref 70–99)
Glucose-Capillary: 208 mg/dL — ABNORMAL HIGH (ref 70–99)
Glucose-Capillary: 218 mg/dL — ABNORMAL HIGH (ref 70–99)
Glucose-Capillary: 226 mg/dL — ABNORMAL HIGH (ref 70–99)
Glucose-Capillary: 228 mg/dL — ABNORMAL HIGH (ref 70–99)
Glucose-Capillary: 94 mg/dL (ref 70–99)
Glucose-Capillary: 100 mg/dL — ABNORMAL HIGH (ref 70–99)
Glucose-Capillary: 119 mg/dL — ABNORMAL HIGH (ref 70–99)
Glucose-Capillary: 90 mg/dL (ref 70–99)
Glucose-Capillary: 202 mg/dL — ABNORMAL HIGH (ref 70–99)

## 2018-04-03 LAB — CBC
HEMATOCRIT: 35.6 % — AB (ref 36.0–46.0)
HEMOGLOBIN: 11.7 g/dL — AB (ref 12.0–15.0)
MCH: 28.2 pg (ref 26.0–34.0)
MCHC: 32.9 g/dL (ref 30.0–36.0)
MCV: 85.8 fL (ref 80.0–100.0)
NRBC: 0 % (ref 0.0–0.2)
Platelets: 421 10*3/uL — ABNORMAL HIGH (ref 150–400)
RBC: 4.15 MIL/uL (ref 3.87–5.11)
RDW: 13.5 % (ref 11.5–15.5)
WBC: 14.3 10*3/uL — AB (ref 4.0–10.5)

## 2018-04-03 MED ORDER — POTASSIUM CHLORIDE IN NACL 40-0.9 MEQ/L-% IV SOLN
INTRAVENOUS | Status: DC
  Administered 2018-04-03 – 2018-04-04 (×2): 125 mL/h via INTRAVENOUS

## 2018-04-03 MED ORDER — OXYCODONE-ACETAMINOPHEN 7.5-325 MG PO TABS
1.0000 | ORAL_TABLET | Freq: Once | ORAL | Status: AC
  Administered 2018-04-03: 1 via ORAL
  Filled 2018-04-03: qty 1

## 2018-04-03 MED ORDER — POTASSIUM CHLORIDE 10 MEQ/100ML IV SOLN
10.0000 meq | INTRAVENOUS | Status: AC
  Administered 2018-04-03 (×4): 10 meq via INTRAVENOUS
  Filled 2018-04-03 (×5): qty 100

## 2018-04-03 MED ORDER — METHOCARBAMOL 1000 MG/10ML IJ SOLN
500.0000 mg | Freq: Once | INTRAVENOUS | Status: AC
  Administered 2018-04-03: 500 mg via INTRAVENOUS
  Filled 2018-04-03: qty 5

## 2018-04-03 MED ORDER — METHOCARBAMOL 1000 MG/10ML IJ SOLN
INTRAMUSCULAR | Status: AC
  Filled 2018-04-03: qty 10

## 2018-04-03 MED ORDER — INSULIN GLARGINE 100 UNIT/ML ~~LOC~~ SOLN
50.0000 [IU] | Freq: Every day | SUBCUTANEOUS | Status: DC
  Administered 2018-04-04: 50 [IU] via SUBCUTANEOUS
  Filled 2018-04-03 (×2): qty 0.5

## 2018-04-03 NOTE — Progress Notes (Signed)
Inpatient Diabetes Program Recommendations  AACE/ADA: New Consensus Statement on Inpatient Glycemic Control (2019)  Target Ranges:  Prepandial:   less than 140 mg/dL      Peak postprandial:   less than 180 mg/dL (1-2 hours)      Critically ill patients:  140 - 180 mg/dL   Results for NAVIL, KOLE (MRN [id number]) as of 04/03/2018 09:46  Ref. Range 04/02/2018 07:52 04/02/2018 11:34 04/02/2018 16:28 04/02/2018 19:27 04/02/2018 23:54 04/03/2018 04:07  Glucose-Capillary Latest Ref Range: 70 - 99 mg/dL 130 (H) 865 (H) 94 92 784 (H) 103 (H)   Review of Glycemic Control  Diabetes history: DM2 Outpatient Diabetes medications: 70/30 60 units TID with meals, Amaryl 4 mg QAM Current orders for Inpatient glycemic control: Lantus 55 units daily, Novolog 0-20 units TID with meals, Novolog 0-5 units QHS  Inpatient Diabetes Program Recommendations: Insulin - Basal: Noted glucose has been stable with Lantus 50 units given on 04/02/18. RN reports patient is NPO and taking only sips of ginger ale. Recommend decreasing Lantus back down to 50 units.  Thanks, Orlando Penner, RN, MSN, CDE Diabetes Coordinator Inpatient Diabetes Program [phone] (Team Pager from 8am to 5pm)

## 2018-04-03 NOTE — Progress Notes (Signed)
PROGRESS NOTE  Deanna Stokes WGN:[id number] DOB: 09-14-1970 DOA: 03/31/2018 PCP: Toma Deiters, MD  Brief History: 47 y.o.femalewith medical history ofdiabetes mellitus, hypertension, anxiety/depression, opioid use disorder presenting with nausea and vomiting that began on the evening of 03/30/2018. However, according to the patient she began having nausea and vomiting on the evening of 03/30/2018 without hematemesis dysuria, hematuria, fevers, chills. She states that she was recently admitted to [facility] present 1 month prior to this admission with similar symptoms. She endorses compliance with her insulin. However, she states that she is not taking Lantus for the past 4 months. She has been switched to 70/30 insulin 60 units 3 times daily.She complained of subjective fevers/chills at home. She denied headache, diarrhea, dysuria, hematochezia, melena. She complained of abdominal pain In the emergency department, the patient was hypothermic with a temperature 95.8 F. She was hemodynamically stable saturating 100% room air. WBC was 13.5. BMP showed serum glucose of 425 with anion gap of 13. LFTs were unremarkable. Urinalysis showed 11-20 WBC with ketonuria.She was started on IV fluids and IV insulin.  Unfortunately, her recovery was slow due in part from the patient's gastropasresis.  Assessment/Plan: DKA type II -patientcontinuedon IV insulin with q 1 hour CBG check and q 4 hour BMPs -pt started on aggressive fluid resuscitation -Electrolytes were monitored and repleted -transitioned to [place] insulin once anion gap closes  Hypothermia -Check a.m. Cortisol--35.6 -Blood cultures x2 sets--neg -10/28 repeat UA with 6-10 WBC -follow urine culture--multiple organisms -discontinue ceftriaxone  Opioid use disorder -Restart home dose Suboxone 8/2 mg in the morning and 4/2 mg in the evening [facility] Controlled Substance Reporting System has  been queried for this patient--pt only has one provider -UDS--neg -holding home po percocet until pt able to tolerate po -d/c IV morphine--likely contributing to vomiting and gastroparesis  Essential hypertension -Holding lisinopril, amlodipine, and clonidine until the patient is able to toleratepo -Hydralazine as needed for SBP creatinine 180 -continue IV lopressor until able to tolerate po  Diabetes mellitus type 2, uncontrolled with hyperglycemia -07/15/2017 hemoglobin A1c 11.3 -10/26/19Repeat hemoglobin A1c--10.9 -decrease lantus to 50 units since pt is npo -resistant ISS  Diabetic gastroparesis--intractable N/V -Patient likely has exacerbation with intractable nausea and vomiting -pt continues to have dry heaving and nausea -IncreasedReglan 10mg  IV every 6 hours -continuearound-the-clock scheduled Zofran -Phenergan as needed intractable vomiting -discontinue morphine IV -CT abd/pelvis--neg for acute findings  Hyperlipidemia -Restart Lipitor once the patient is able to toleratepo  Depression/anxiety -Restart Cymbalta, trazodone, and Atarax once the patient is able to toleratepo   Disposition Plan: Home in 2-3days  Family Communication:NoFamily at bedside  Consultants:none  Code Status: FULL   DVT Prophylaxis: [place] Lovenox   Procedures: As Listed in Progress Note Above  Antibiotics: Ceftriaxone 10/28>>>10/29      Subjective: Pt states abd pain is improving.  She states she is still vomting, although none has been confirmed by nursing.  Denies f/c, cp, sob, dysuria, diarrhea, headache  Objective: Vitals:   04/03/18 1144 04/03/18 1600 04/03/18 1653 04/03/18 1700  BP:  (!) 115/96  (!) 158/101  Pulse:  (!) 102  92  Resp:  18  20  Temp: 98.6 F (37 C)  98.1 F (36.7 C)   TempSrc: Oral  Oral   SpO2:  100%  99%  Weight:      Height:        Intake/Output Summary (Last 24 hours) at 04/03/2018 1834 Last  data filed at  04/03/2018 1743 Gross per 24 hour  Intake 2223.12 ml  Output 2000 ml  Net 223.12 ml   Weight change: 2.3 kg Exam:   General:  Pt is alert, follows commands appropriately, not in acute distress  HEENT: No icterus, No thrush, No neck mass, [place]/AT  Cardiovascular: RRR, S1/S2, no rubs, no gallops  Respiratory: CTA bilaterally, no wheezing, no crackles, no rhonchi  Abdomen: Soft/+BS, non tender, non distended, no guarding  Extremities: No edema, No lymphangitis, No petechiae, No rashes, no synovitis   Data Reviewed: I have personally reviewed following labs and imaging studies Basic Metabolic Panel: Recent Labs  Lab 04/02/18 0441  04/02/18 2053 04/03/18 0109 04/03/18 0407 04/03/18 1038 04/03/18 1400  NA 134*   < > 135 134* 136 136 136  K 3.5   < > 3.1* 2.9* 3.0* 3.1* 2.9*  CL 104   < > 104 103 104 104 104  CO2 17*   < > 21* 22 22 23 25   GLUCOSE 211*   < > 114* 122* 120* 134* 113*  BUN 9   < > 7 7 7 7 7   CREATININE 0.62   < > 0.50 0.57 0.56 0.55 0.50  CALCIUM 8.8*   < > 8.6* 8.5* 8.6* 8.5* 8.5*  MG 1.6*  --   --   --   --   --   --    < > = values in this interval not displayed.   Liver Function Tests: Recent Labs  Lab 03/31/18 0721  AST 17  ALT 17  ALKPHOS 144*  BILITOT 1.0  PROT 8.9*  ALBUMIN 4.0   Recent Labs  Lab 03/31/18 0721  LIPASE 27   No results for input(s): AMMONIA in the last 168 hours. Coagulation Profile: No results for input(s): INR, PROTIME in the last 168 hours. CBC: Recent Labs  Lab 03/31/18 0721 04/02/18 0441 04/03/18 0407  WBC 13.5* 18.6* 14.3*  NEUTROABS 10.0*  --   --   HGB 12.4 11.4* 11.7*  HCT 38.3 35.2* 35.6*  MCV 83.3 86.1 85.8  PLT 538* 457* 421*   Cardiac Enzymes: No results for input(s): CKTOTAL, CKMB, CKMBINDEX, TROPONINI in the last 168 hours. BNP: Invalid input(s): POCBNP CBG: Recent Labs  Lab 04/02/18 2354 04/03/18 0407 04/03/18 0841 04/03/18 1136 04/03/18 1650  GLUCAP 105* 103* 100* 119* 90    HbA1C: No results for input(s): HGBA1C in the last 72 hours. Urine analysis:    Component Value Date/Time   COLORURINE YELLOW 04/02/2018 1735   APPEARANCEUR HAZY (A) 04/02/2018 1735   LABSPEC 1.013 04/02/2018 1735   PHURINE 6.0 04/02/2018 1735   GLUCOSEU NEGATIVE 04/02/2018 1735   HGBUR NEGATIVE 04/02/2018 1735   BILIRUBINUR NEGATIVE 04/02/2018 1735   KETONESUR 20 (A) 04/02/2018 1735   PROTEINUR NEGATIVE 04/02/2018 1735   NITRITE NEGATIVE 04/02/2018 1735   LEUKOCYTESUR LARGE (A) 04/02/2018 1735   Sepsis Labs: @LABRCNTIP (procalcitonin:4,lacticidven:4) ) Recent Results (from the past 240 hour(s))  Urine Culture     Status: Abnormal   Collection Time: 03/31/18  9:27 AM  Result Value Ref Range Status   Specimen Description   Final    URINE, CLEAN CATCH Performed at [facility], [address], [place], [place] [address]    Special Requests   Final    NONE Performed at [facility], [address], [place], [place] [address]    Culture MULTIPLE SPECIES PRESENT, SUGGEST RECOLLECTION (A)  Final   Report Status 04/02/2018 FINAL  Final  MRSA PCR Screening     Status: None   Collection Time: 03/31/18 11:53 AM  Result Value Ref Range Status   MRSA by PCR NEGATIVE NEGATIVE Final    Comment:        The GeneXpert MRSA Assay (FDA approved for NASAL specimens only), is one component of a comprehensive MRSA colonization surveillance program. It is not intended to diagnose MRSA infection nor to guide or monitor treatment for MRSA infections. Performed at [facility], [address] [place], [place] [address]   Culture, blood (Routine X 2) w Reflex to ID Panel     Status: None (Preliminary result)   Collection Time: 03/31/18  3:47 PM  Result Value Ref Range Status   Specimen Description BLOOD LEFT HAND  Final   Special Requests   Final    BOTTLES DRAWN AEROBIC AND ANAEROBIC Blood Culture adequate volume   Culture   Final    NO GROWTH 3 DAYS Performed at [facility],  [address], [place], [place] [address]    Report Status PENDING  Incomplete  Culture, blood (Routine X 2) w Reflex to ID Panel     Status: None (Preliminary result)   Collection Time: 03/31/18  6:17 PM  Result Value Ref Range Status   Specimen Description BLOOD LEFT HAND  Final   Special Requests   Final    BOTTLES DRAWN AEROBIC ONLY Blood Culture results may not be optimal due to an inadequate volume of blood received in culture bottles   Culture   Final    NO GROWTH 3 DAYS Performed at [facility], [address], [place], [place] [address]    Report Status PENDING  Incomplete     Scheduled Meds: . aspirin EC  81 mg Oral Daily  . buprenorphine-naloxone  1 tablet Sublingual Daily  . Chlorhexidine Gluconate Cloth  6 each Topical Daily  . DULoxetine  60 mg Oral Daily  . enoxaparin (LOVENOX) injection  50 mg Subcutaneous Q24H  . estradiol  0.075 mg Transdermal Q3 days  . insulin aspart  0-20 Units Subcutaneous TID WC  . insulin aspart  0-5 Units Subcutaneous QHS  . insulin glargine  55 Units Subcutaneous Daily  . metoCLOPramide (REGLAN) injection  10 mg Intravenous Q6H  . metoprolol tartrate  2.5 mg Intravenous Q6H  . ondansetron (ZOFRAN) IV  4 mg Intravenous Q6H  . sodium chloride flush  10-40 mL Intracatheter Q12H   Continuous Infusions: . 0.9 % NaCl with KCl 20 mEq / L 125 mL/hr at 04/03/18 1150  . cefTRIAXone (ROCEPHIN)  IV 1 g (04/03/18 1727)  . famotidine (PEPCID) IV Stopped (04/03/18 0956)    Procedures/Studies: Ct Abdomen Pelvis W Contrast  Result Date: 04/03/2018 CLINICAL DATA:  Nausea and vomiting beginning 03/30/2018. Upper abdominal pain. EXAM: CT ABDOMEN AND PELVIS WITH CONTRAST TECHNIQUE: Multidetector CT imaging of the abdomen and pelvis was performed using the standard protocol following bolus administration of intravenous contrast. CONTRAST:  ISOVUE-300 IOPAMIDOL (ISOVUE-300) INJECTION 61% COMPARISON:  09/11/2017 FINDINGS: Lower chest: Lung bases are  unremarkable. There is mild cardiomegaly. Hepatobiliary: Previous cholecystectomy. Liver and biliary tree are normal. Pancreas: Normal. Spleen: Normal. Adrenals/Urinary Tract: Adrenal glands are normal. Kidneys normal in size without hydronephrosis or nephrolithiasis. Ureters are within normal. Bladder is somewhat decompressed with mild wall thickening likely due to decompressed state. Stomach/Bowel: Stomach and small bowel are normal. Appendix is normal. Colon is within normal. Vascular/Lymphatic: Normal. Reproductive: Previous hysterectomy. Other: No free fluid or focal inflammatory change. Musculoskeletal:  Mild degenerative change of the hips. IMPRESSION: No acute findings in the abdomen/pelvis. Mild wall thickening of the bladder likely due to decompression although can be seen with cystitis. Electronically Signed   By: Elberta Fortis M.D.   On: 04/03/2018 02:59    Catarina Hartshorn, DO  [facility] Pager [phone]  If 7PM-7AM, please contact night-coverage [email or web address] Password [email or web address] 04/03/2018, 6:34 PM   LOS: 3 days

## 2018-04-04 LAB — BASIC METABOLIC PANEL
ANION GAP: 6 (ref 5–15)
BUN: 5 mg/dL — ABNORMAL LOW (ref 6–20)
CO2: 26 mmol/L (ref 22–32)
Calcium: 8.6 mg/dL — ABNORMAL LOW (ref 8.9–10.3)
Chloride: 104 mmol/L (ref 98–111)
Creatinine, Ser: 0.5 mg/dL (ref 0.44–1.00)
GFR calc Af Amer: >60 mL/min (ref >60)
Glucose, Bld: 133 mg/dL — ABNORMAL HIGH (ref 70–99)
POTASSIUM: 3.2 mmol/L — AB (ref 3.5–5.1)
SODIUM: 136 mmol/L (ref 135–145)

## 2018-04-04 LAB — CBC
HCT: 35.9 % — ABNORMAL LOW (ref 36.0–46.0)
HEMOGLOBIN: 11.7 g/dL — AB (ref 12.0–15.0)
MCH: 28 pg (ref 26.0–34.0)
MCHC: 32.6 g/dL (ref 30.0–36.0)
MCV: 85.9 fL (ref 80.0–100.0)
PLATELETS: 408 10*3/uL — AB (ref 150–400)
RBC: 4.18 MIL/uL (ref 3.87–5.11)
RDW: 13.3 % (ref 11.5–15.5)
WBC: 13.4 10*3/uL — AB (ref 4.0–10.5)
nRBC: 0 % (ref 0.0–0.2)

## 2018-04-04 LAB — GLUCOSE, CAPILLARY
GLUCOSE-CAPILLARY: 104 mg/dL — AB (ref 70–99)
Glucose-Capillary: 125 mg/dL — ABNORMAL HIGH (ref 70–99)

## 2018-04-04 LAB — MAGNESIUM: MAGNESIUM: 2.1 mg/dL (ref 1.7–2.4)

## 2018-04-04 MED ORDER — LISINOPRIL 40 MG PO TABS: 40.0000 mg | ORAL_TABLET | Freq: Every day | ORAL | 2 refills | Status: AC

## 2018-04-04 MED ORDER — METOCLOPRAMIDE HCL 10 MG PO TABS: 5.0000 mg | ORAL_TABLET | Freq: Three times a day (TID) | ORAL | 2 refills | Status: AC

## 2018-04-04 MED ORDER — FAMOTIDINE 20 MG PO TABS: 20.0000 mg | ORAL_TABLET | Freq: Two times a day (BID) | ORAL | Status: DC

## 2018-04-04 MED ORDER — AMLODIPINE BESYLATE 10 MG PO TABS: 10.0000 mg | ORAL_TABLET | Freq: Every day | ORAL | 2 refills | Status: AC

## 2018-04-04 MED ORDER — BUPRENORPHINE HCL-NALOXONE HCL 8-2 MG SL SUBL
1.0000 | SUBLINGUAL_TABLET | Freq: Once | SUBLINGUAL | Status: AC
  Administered 2018-04-04: 1 via SUBLINGUAL
  Filled 2018-04-04: qty 1

## 2018-04-04 MED ORDER — POTASSIUM CHLORIDE CRYS ER 20 MEQ PO TBCR
40.0000 meq | EXTENDED_RELEASE_TABLET | Freq: Once | ORAL | Status: AC
  Administered 2018-04-04: 40 meq via ORAL
  Filled 2018-04-04: qty 2

## 2018-04-04 MED ORDER — HYDROXYZINE HCL 10 MG PO TABS: 10.0000 mg | ORAL_TABLET | Freq: Three times a day (TID) | ORAL | 0 refills | Status: AC | PRN

## 2018-04-04 MED ORDER — INSULIN ASPART 100 UNIT/ML FLEXPEN: 0.0000 [IU] | PEN_INJECTOR | Freq: Three times a day (TID) | SUBCUTANEOUS | 11 refills | Status: AC

## 2018-04-04 MED ORDER — CLONIDINE HCL 0.1 MG PO TABS: 0.1000 mg | ORAL_TABLET | Freq: Two times a day (BID) | ORAL | 11 refills | Status: AC

## 2018-04-04 NOTE — Discharge Summary (Signed)
Deanna Stokes, is a 47 y.o. female  DOB [birth year]  MRN [id number].  Admission date:  03/31/2018  Admitting Physician  Catarina Hartshorn, MD  Discharge Date:  04/04/2018   Primary MD  Toma Deiters, MD  Recommendations for primary care physician for things to follow:   1)Use insulin sliding scale as ordered  insulin aspart (novoLOG) injection 0-20 Units 0-20 Units, Subcutaneous, 3 times daily with meals,  Correction coverage: CBG < 70: implement hypoglycemia protocol CBG 70 - 120: 0 units CBG 121 - 150: 3 units CBG 151 - 200: 4 units CBG 201 - 250: 7 units CBG 251 - 300: 11 units CBG 301 - 350: 15 units CBG 351 - 400: 20 units CBG > 400: 2) compliance/adherence to diabetic diet and insulin therapy strongly advised 3) may use hydroxyzine as needed for nausea and anxiety 4) follow-up with a primary care doctor within a week for recheck   Admission Diagnosis  Gastroparesis [K31.84] Hyperglycemia [R73.9]   Discharge Diagnosis  Gastroparesis [K31.84] Hyperglycemia [R73.9]    Active Problems:   Essential hypertension, benign   Class 2 severe obesity due to excess calories with serious comorbidity and body mass index (BMI) of 39.0 to 39.9 in adult [facility])   Intractable vomiting   Diabetic gastroparesis (HCC)   Polypharmacy   DKA, type 2 (HCC)   Uncontrolled type 2 diabetes mellitus with hyperglycemia (HCC)   Hyperglycemia   Gastroparesis      Past Medical History:  Diagnosis Date  . Anxiety   . Depression   . Diabetes mellitus without complication (HCC)   . Gastroparesis   . Hypertension   . Neuropathy     Past Surgical History:  Procedure Laterality Date  . ABDOMINAL HYSTERECTOMY    . CHOLECYSTECTOMY    . TUBAL LIGATION       HPI  from the history and physical done on the day of admission:    Patient coming from: Home  Chief Complaint: abdominal pain with n/v  HPI:  Deanna Stokes is a 48 y.o. female with medical history of diabetes mellitus, hypertension, anxiety/depression, opioid use disorder presenting with nausea and vomiting that began on the evening of 03/30/2018.  History is limited at the time of admission secondary to mild extremis.  However, according to the patient she began having nausea and vomiting on the evening of 03/30/2018 without hematemesis dysuria, hematuria, fevers, chills.  She states that she was recently admitted to [facility] present 1 month prior to this admission with similar symptoms.  She endorses compliance with her insulin.  However, she states that she is not taking Lantus for the past 4 months.  She has been switched to 70/30 insulin 60 units 3 times daily.  She denies any chest pain, headache, dysuria, hematuria.  She has some shortness of breath.  She denies any coughing or hemoptysis.  She last took her Suboxone on the morning of 03/30/2018.  She denies any recent travels or new medications.  In the emergency department,  the patient was hypothermic with a temperature 95.8 F.  She was hemodynamically stable saturating 100% room air.  WBC was 13.5.  BMP showed serum glucose of 425 with anion gap of 13.  LFTs were unremarkable.  Urinalysis showed 11-20 WBC with ketonuria.  The patient will start IV fluids and IV insulin.     Hospital Course:     Brief History: 47 y.o.femalewith medical history ofdiabetes mellitus, hypertension, anxiety/depression, opioid use disorder presenting with nausea and vomiting that began on the evening of 03/30/2018. However, according to the patient she began having nausea and vomiting on the evening of 03/30/2018 without hematemesis dysuria, hematuria, fevers, chills. She states that she was recently admitted to [facility] present 1 month prior to this admission with similar symptoms. She endorses compliance with her insulin. However, she states that she is not taking Lantus for the past 4  months. She has been switched to 70/30 insulin 60 units 3 times daily.She complained of subjective fevers/chills at home. She denied headache, diarrhea, dysuria, hematochezia, melena. She complained of abdominal pain In the emergency department, the patient was hypothermic with a temperature 95.8 F. She was hemodynamically stable saturating 100% room air. WBC was 13.5. BMP showed serum glucose of 425 with anion gap of 13. LFTs were unremarkable. Urinalysis showed 11-20 WBC with ketonuria.She was started on IV fluids and IV insulin. Unfortunately, her recovery was slow due in part from the patient's gastropasresis.  Assessment/Plan: DKA type II -Acidosis and elevated anion gap resolved with IV insulin, patient was transitioned to subcu insulin --- A1c is 10.9 reflecting poor control, need to be compliant with lifestyle and medication therapy advised emphasized, lantus insulin 50 units and novolog sliding scale  Hypothermia--resolved -  a.m. Cortisol--35.6 -Blood cultures x2 sets--neg -10/28 repeat UA with 6-10 WBC --Treated with ceftriaxone for Citrobacter UTI  Opioid use disorder -Discharge home on home dose Suboxone 8/2 mg in the morning and 4/2 mg in the evening The [facility] Controlled Substance Reporting System has been queried for this patient--pt only has one provider -UDS--neg -Advised to discontinue home dose of po percocet   Essential hypertension -Improved BP control, okay to restart lisinopril, amlodipine, and clonidine     Diabetic gastroparesis--intractable N/V - -CT abd/pelvis--neg for acute findings Overall much improved, tolerating oral intake, discharged home on Reglan and hydroxyzine  Hyperlipidemia -Restart Lipitor  Depression/anxiety -Restart Cymbalta, trazodone, and Atarax    Disposition Plan: Home  Family Communication:NoFamily at bedside  Consultants:none  Code Status: FULL   Discharge Condition: stable  Follow  UP-PCP   Diet and Activity recommendation:  As advised  Discharge Instructions    Discharge Instructions    Call MD for:  difficulty breathing, headache or visual disturbances   Complete by:  As directed    Call MD for:  persistant dizziness or light-headedness   Complete by:  As directed    Call MD for:  persistant nausea and vomiting   Complete by:  As directed    Call MD for:  severe uncontrolled pain   Complete by:  As directed    Call MD for:  temperature >100.4   Complete by:  As directed    Diet - low sodium heart healthy   Complete by:  As directed    Diet Carb Modified   Complete by:  As directed    Discharge instructions   Complete by:  As directed    1)Use insulin sliding scale as ordered  insulin aspart (novoLOG) injection 0-20  Units 0-20 Units, Subcutaneous, 3 times daily with meals,  Correction coverage: CBG < 70: implement hypoglycemia protocol CBG 70 - 120: 0 units CBG 121 - 150: 3 units CBG 151 - 200: 4 units CBG 201 - 250: 7 units CBG 251 - 300: 11 units CBG 301 - 350: 15 units CBG 351 - 400: 20 units CBG > 400: 2) compliance/adherence to diabetic diet and insulin therapy strongly advised 3) may use hydroxyzine as needed for nausea and anxiety 4) follow-up with a primary care doctor within a week for recheck   Increase activity slowly   Complete by:  As directed         Discharge Medications     Allergies as of 04/04/2018      Reactions   Ibuprofen Shortness Of Breath, Rash   Ciprofloxacin Hives   Metformin And Related Other (See Comments)   MD told patient not to take   Hydrocodone-acetaminophen Rash   Pt states she can take acetaminophen      Medication List    STOP taking these medications   glimepiride 4 MG tablet Commonly known as:  AMARYL   oxyCODONE-acetaminophen 7.5-325 MG tablet Commonly known as:  PERCOCET     TAKE these medications   amLODipine 10 MG tablet Commonly known as:  NORVASC Take 1 tablet (10 mg total) by  mouth daily. For BP What changed:    medication strength  additional instructions   aspirin EC 81 MG tablet Take 81 mg by mouth daily.   atorvastatin 20 MG tablet Commonly known as:  LIPITOR Take 20 mg by mouth daily.   buprenorphine 8 MG Subl SL tablet Commonly known as:  SUBUTEX Place 4-8 mg under the tongue 2 (two) times daily. 8MG  IN THE MORNING AND 4MG  IN THE EVENING   cloNIDine 0.1 MG tablet Commonly known as:  CATAPRES Take 1 tablet (0.1 mg total) by mouth 2 (two) times daily. For BP What changed:  additional instructions   DULoxetine 60 MG capsule Commonly known as:  CYMBALTA Take 60 mg by mouth daily.   esomeprazole 40 MG capsule Commonly known as:  NEXIUM Take 1 capsule (40 mg total) by mouth 2 (two) times daily before a meal.   estradiol 0.075 mg/24hr patch Commonly known as:  CLIMARA - Dosed in mg/24 hr Place 0.075 mg onto the skin every 3 (three) days.   FREESTYLE LIBRE SENSOR SYSTEM Misc Use one sensor every 10 days.   furosemide 20 MG tablet Commonly known as:  LASIX Take 20 mg by mouth daily.   gabapentin 600 MG tablet Commonly known as:  NEURONTIN Take 1 tablet by mouth 3 (three) times daily.   glucagon 1 MG injection Inject 1 mg into the vein once as needed.   hydrOXYzine 25 MG tablet Commonly known as:  ATARAX/VISTARIL Take 25 mg by mouth 3 (three) times daily as needed for anxiety. What changed:  Another medication with the same name was added. Make sure you understand how and when to take each.   hydrOXYzine 10 MG tablet Commonly known as:  ATARAX/VISTARIL Take 1 tablet (10 mg total) by mouth 3 (three) times daily as needed. What changed:  You were already taking a medication with the same name, and this prescription was added. Make sure you understand how and when to take each.   insulin aspart 100 UNIT/ML FlexPen Commonly known as:  NOVOLOG Inject 0-20 Units into the skin 3 (three) times daily with meals. insulin aspart (novoLOG)  injection 0-20  Units 0-20 Units, Subcutaneous, 3 times daily with meals,  Correction coverage: CBG < 70: implement hypoglycemia protocol CBG 70 - 120: 0 units CBG 121 - 150: 3 units CBG 151 - 200: 4 units CBG 201 - 250: 7 units CBG 251 - 300: 11 units CBG 301 - 350: 15 units CBG 351 - 400: 20 units CBG > 400:   Insulin Glargine 100 UNIT/ML Solostar Pen Commonly known as:  LANTUS Inject 50 Units into the skin 2 times daily at 12 noon and 4 pm.   lisinopril 40 MG tablet Commonly known as:  PRINIVIL,ZESTRIL Take 1 tablet (40 mg total) by mouth daily.   methocarbamol 500 MG tablet Commonly known as:  ROBAXIN Take 500 mg by mouth every 6 (six) hours.   metoCLOPramide 10 MG tablet Commonly known as:  REGLAN Take 0.5 tablets (5 mg total) by mouth 4 (four) times daily -  before meals and at bedtime. What changed:    how much to take  when to take this   NOVOLOG MIX 70/30 FLEXPEN (70-30) 100 UNIT/ML FlexPen Generic drug:  insulin aspart protamine - aspart INJECT 60 UNITS IN IN THE MORNING, 60 UNITS AT NOON AN 60 UNITS IN THE EVENING   omega-3 acid ethyl esters 1 g capsule Commonly known as:  LOVAZA Take 1 g by mouth 2 (two) times daily. OMACOR   ondansetron 8 MG disintegrating tablet Commonly known as:  ZOFRAN-ODT Take 1 tablet (8 mg total) by mouth every 8 (eight) hours as needed for nausea or vomiting.   promethazine 6.25 MG/5ML syrup Commonly known as:  PHENERGAN Take 25 mg by mouth every 6 (six) hours as needed for nausea or vomiting.   traZODone 50 MG tablet Commonly known as:  DESYREL Take 50 mg by mouth at bedtime as needed for sleep.   Vitamin D (Ergocalciferol) 50000 units Caps capsule Commonly known as:  DRISDOL Take 50,000 Units by mouth 2 (two) times a week. Sunday and Thursday   zolpidem 10 MG tablet Commonly known as:  AMBIEN Take 10 mg by mouth at bedtime.       Major procedures and Radiology Reports - PLEASE review detailed and final reports for  all details, in brief -    Ct Abdomen Pelvis W Contrast  Result Date: 04/03/2018 CLINICAL DATA:  Nausea and vomiting beginning 03/30/2018. Upper abdominal pain. EXAM: CT ABDOMEN AND PELVIS WITH CONTRAST TECHNIQUE: Multidetector CT imaging of the abdomen and pelvis was performed using the standard protocol following bolus administration of intravenous contrast. CONTRAST:  ISOVUE-300 IOPAMIDOL (ISOVUE-300) INJECTION 61% COMPARISON:  09/11/2017 FINDINGS: Lower chest: Lung bases are unremarkable. There is mild cardiomegaly. Hepatobiliary: Previous cholecystectomy. Liver and biliary tree are normal. Pancreas: Normal. Spleen: Normal. Adrenals/Urinary Tract: Adrenal glands are normal. Kidneys normal in size without hydronephrosis or nephrolithiasis. Ureters are within normal. Bladder is somewhat decompressed with mild wall thickening likely due to decompressed state. Stomach/Bowel: Stomach and small bowel are normal. Appendix is normal. Colon is within normal. Vascular/Lymphatic: Normal. Reproductive: Previous hysterectomy. Other: No free fluid or focal inflammatory change. Musculoskeletal: Mild degenerative change of the hips. IMPRESSION: No acute findings in the abdomen/pelvis. Mild wall thickening of the bladder likely due to decompression although can be seen with cystitis. Electronically Signed   By: Elberta Fortis M.D.   On: 04/03/2018 02:59    Micro Results    Recent Results (from the past 240 hour(s))  Urine Culture     Status: Abnormal   Collection Time: 03/31/18  9:27 AM  Result Value Ref Range Status   Specimen Description   Final    URINE, CLEAN CATCH Performed at [facility], [address], [place], [place] [address]    Special Requests   Final    NONE Performed at [facility], [address], [place], [place] [address]    Culture MULTIPLE SPECIES PRESENT, SUGGEST RECOLLECTION (A)  Final   Report Status 04/02/2018 FINAL  Final  MRSA PCR Screening     Status: None   Collection  Time: 03/31/18 11:53 AM  Result Value Ref Range Status   MRSA by PCR NEGATIVE NEGATIVE Final    Comment:        The GeneXpert MRSA Assay (FDA approved for NASAL specimens only), is one component of a comprehensive MRSA colonization surveillance program. It is not intended to diagnose MRSA infection nor to guide or monitor treatment for MRSA infections. Performed at [facility], [address], [place], [place] [address]   Culture, blood (Routine X 2) w Reflex to ID Panel     Status: None (Preliminary result)   Collection Time: 03/31/18  3:47 PM  Result Value Ref Range Status   Specimen Description BLOOD LEFT HAND  Final   Special Requests   Final    BOTTLES DRAWN AEROBIC AND ANAEROBIC Blood Culture adequate volume   Culture   Final    NO GROWTH 4 DAYS Performed at [facility], [address], [place], [place] [address]    Report Status PENDING  Incomplete  Culture, blood (Routine X 2) w Reflex to ID Panel     Status: None (Preliminary result)   Collection Time: 03/31/18  6:17 PM  Result Value Ref Range Status   Specimen Description BLOOD LEFT HAND  Final   Special Requests   Final    BOTTLES DRAWN AEROBIC ONLY Blood Culture results may not be optimal due to an inadequate volume of blood received in culture bottles   Culture   Final    NO GROWTH 4 DAYS Performed at [facility], [address], [place], [place] [address]    Report Status PENDING  Incomplete  Culture, Urine     Status: Abnormal (Preliminary result)   Collection Time: 04/02/18  5:35 PM  Result Value Ref Range Status   Specimen Description   Final    URINE, RANDOM Performed at [facility], [address] [place], [place] [address]    Special Requests   Final    NONE Performed at [facility], [address] [place], [place] [address]    Culture 80,000 COLONIES/mL CITROBACTER KOSERI (A)  Final   Report Status PENDING  Incomplete       Today   Subjective    Dailee Shipton today has no new  complaints, eating and drinking okay, no further emesis, no fever no chills, no dysuria, no flank pain,          Patient has been seen and examined prior to discharge   Objective   Blood pressure (!) 145/112, pulse 99, temperature 99.5 F (37.5 C), temperature source Axillary, resp. rate 20, height 5\' 7"  (1.702 m), weight 110.5 kg, SpO2 100 %.   Intake/Output Summary (Last 24 hours) at 04/04/2018 1340 Last data filed at 04/04/2018 1139 Gross per 24 hour  Intake 1554.33 ml  Output 4701 ml  Net -3146.67 ml    Exam Gen:- Awake Alert,  In no apparent distress  HEENT:- [place].AT, No sclera icterus Neck-Supple Neck,No JVD,.  Lungs-  CTAB ,  good air movement CV- S1, S2 normal, regular Abd-  +ve B.Sounds, Abd Soft, No tenderness, no CVA area tenderness Extremity/Skin:- No  edema, good  pulses Psych-affect is appropriate, oriented x3 Neuro-no new focal deficits, no tremors   Data Review   CBC w Diff:  Lab Results  Component Value Date   WBC 13.4 (H) 04/04/2018   HGB 11.7 (L) 04/04/2018   HCT 35.9 (L) 04/04/2018   PLT 408 (H) 04/04/2018   LYMPHOPCT 19 03/31/2018   MONOPCT 5 03/31/2018   EOSPCT 1 03/31/2018   BASOPCT 1 03/31/2018    CMP:  Lab Results  Component Value Date   NA 136 04/04/2018   K 3.2 (L) 04/04/2018   CL 104 04/04/2018   CO2 26 04/04/2018   BUN 5 (L) 04/04/2018   BUN 5 02/16/2017   CREATININE 0.50 04/04/2018   PROT 8.9 (H) 03/31/2018   ALBUMIN 4.0 03/31/2018   BILITOT 1.0 03/31/2018   ALKPHOS 144 (H) 03/31/2018   AST 17 03/31/2018   ALT 17 03/31/2018  .   Total Discharge time is about 33 minutes  Shon Hale M.D on 04/04/2018 at 1:40 PM  Pager---[phone]  Go to [email or web address] - password [email or web address] for contact info  [facility] - Office  [phone]

## 2018-04-04 NOTE — Progress Notes (Signed)
Discharge instructions given to patient. Patient verbalized understanding of instructions. Patient discharged home with family in stable condition. 

## 2018-04-04 NOTE — Discharge Instructions (Signed)
1)Use insulin sliding scale as ordered  insulin aspart (novoLOG) injection 0-20 Units 0-20 Units, Subcutaneous, 3 times daily with meals,  Correction coverage: CBG < 70: implement hypoglycemia protocol CBG 70 - 120: 0 units CBG 121 - 150: 3 units CBG 151 - 200: 4 units CBG 201 - 250: 7 units CBG 251 - 300: 11 units CBG 301 - 350: 15 units CBG 351 - 400: 20 units CBG > 400: 2) compliance/adherence to diabetic diet and insulin therapy strongly advised 3) may use hydroxyzine as needed for nausea and anxiety 4) follow-up with a primary care doctor within a week for recheck

## 2018-04-05 LAB — URINE CULTURE: Culture: 80000 — AB

## 2018-04-05 LAB — CULTURE, BLOOD (ROUTINE X 2)
CULTURE: NO GROWTH
Culture: NO GROWTH
Special Requests: ADEQUATE

## 2018-06-21 ENCOUNTER — Ambulatory Visit (HOSPITAL_COMMUNITY): Payer: Self-pay | Admitting: Psychiatry

## 2019-07-10 IMAGING — CT CT ABD-PELV W/ CM
2 of 5 series · 16 of 46 positions shown, 18 images · IV contrast (iopamidol)
Comparison: CT scan of July 14, 2017.

CLINICAL DATA: Nausea and vomiting.

EXAM:
CT ABDOMEN AND PELVIS WITH CONTRAST
TECHNIQUE: Multidetector CT imaging of the abdomen and pelvis was performed
using the standard protocol following bolus administration of
intravenous contrast.
CONTRAST:  100mL [id number] IOPAMIDOL (TQIJ47-JQQ) INJECTION 61%

[Series 2: axial st · axial · 0.76mm/px · z∈[+973,+1393]mm · 13 of 96 slices shown, 15 images]
[im 6/96  soft-tissue]
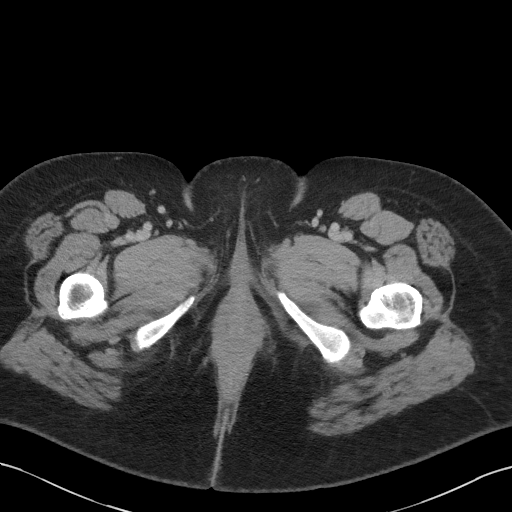
[im 6/96  bone]
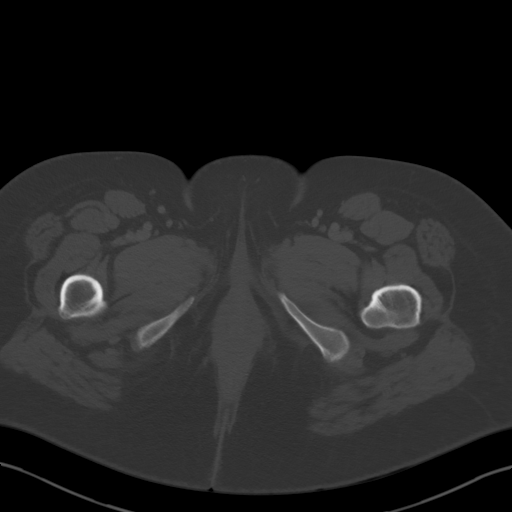
[im 12/96  soft-tissue]
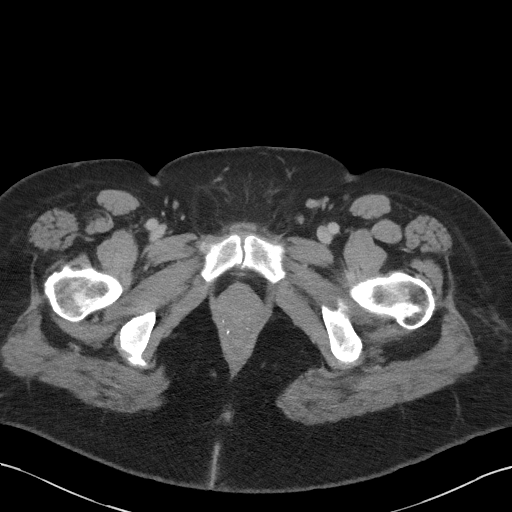
[im 23/96  soft-tissue]
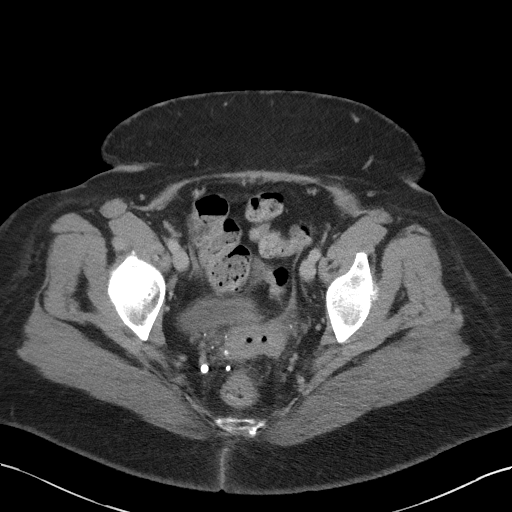
[im 28/96  soft-tissue]
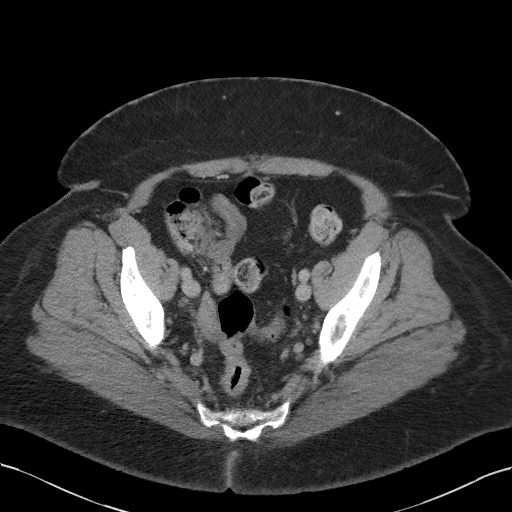
[im 34/96  soft-tissue]
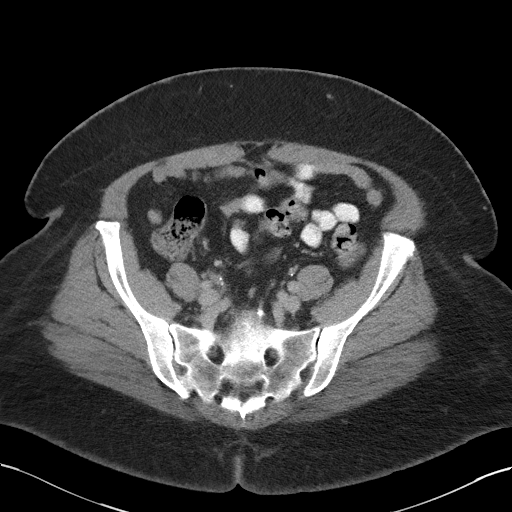
[im 40/96  soft-tissue]
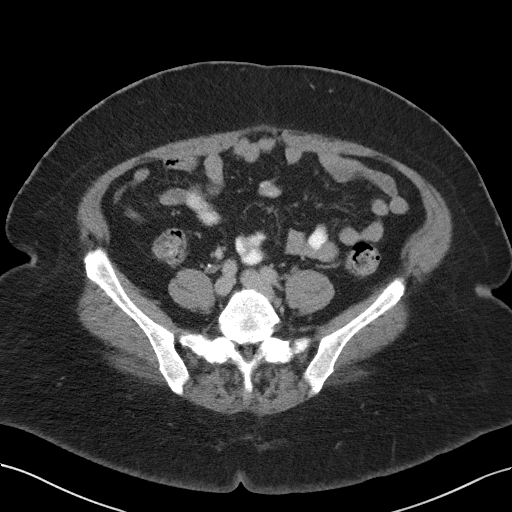
[im 51/96  soft-tissue]
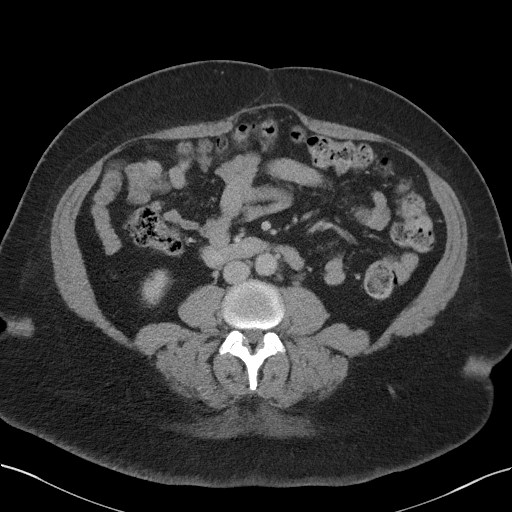
[im 56/96  soft-tissue]
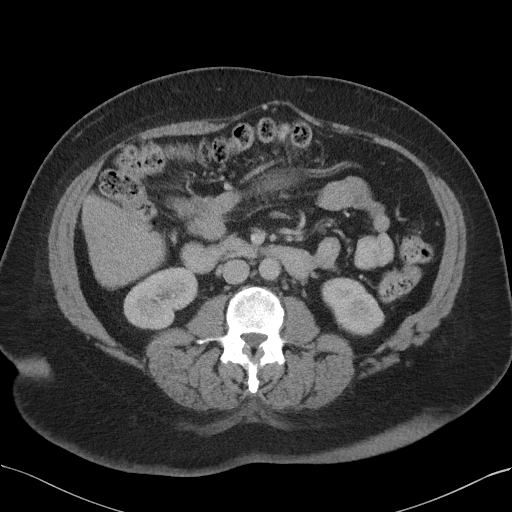
[im 62/96  soft-tissue]
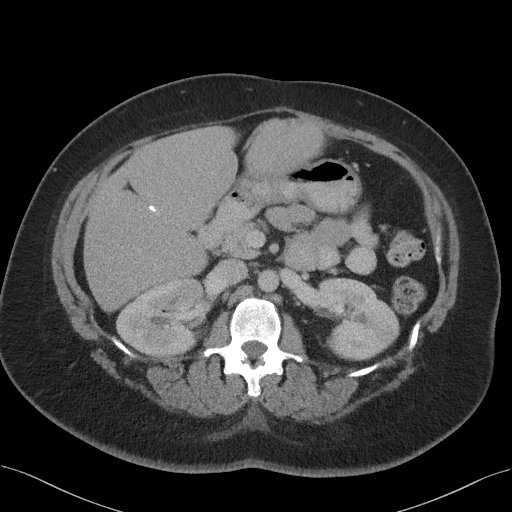
[im 62/96  bone]
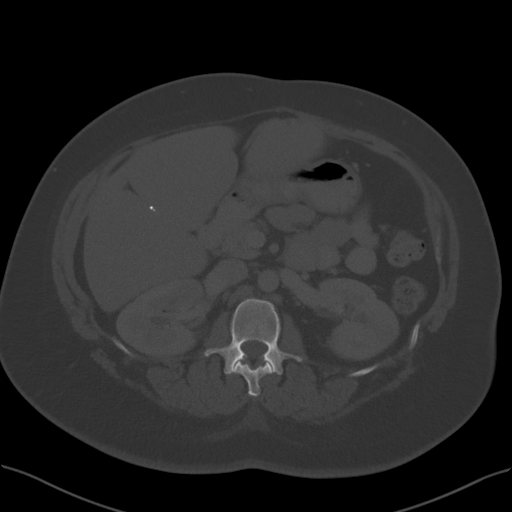
[im 68/96  soft-tissue]
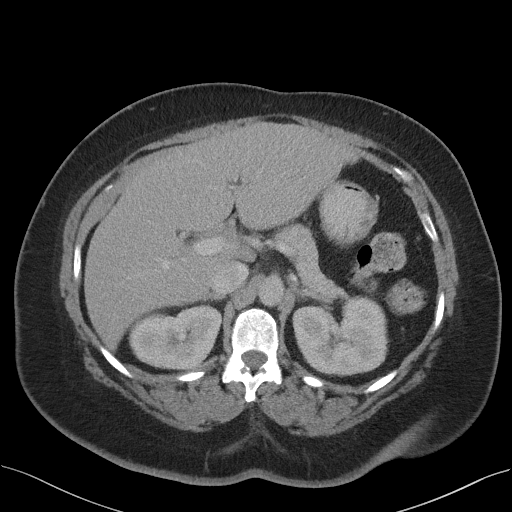
[im 73/96  soft-tissue]
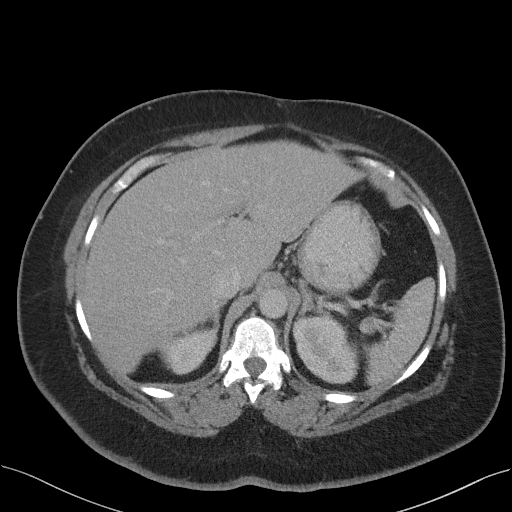
[im 84/96  soft-tissue]
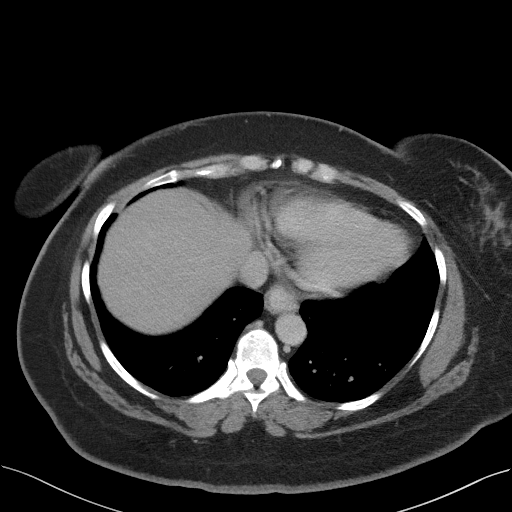
[im 90/96  soft-tissue]
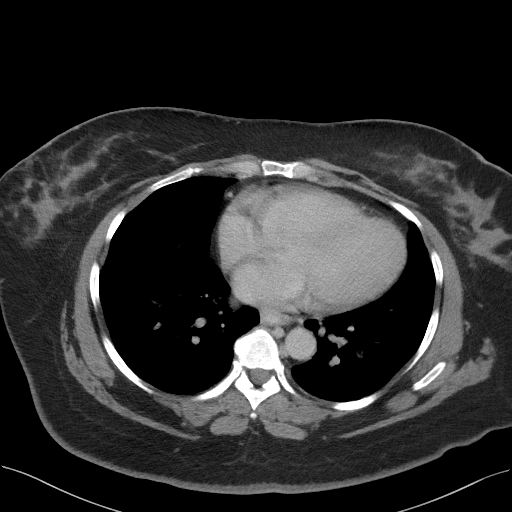

[Series 6: coronal st · coronal · 0.73mm/px · 3 of 107 slices shown]
[im 36/107  soft-tissue]
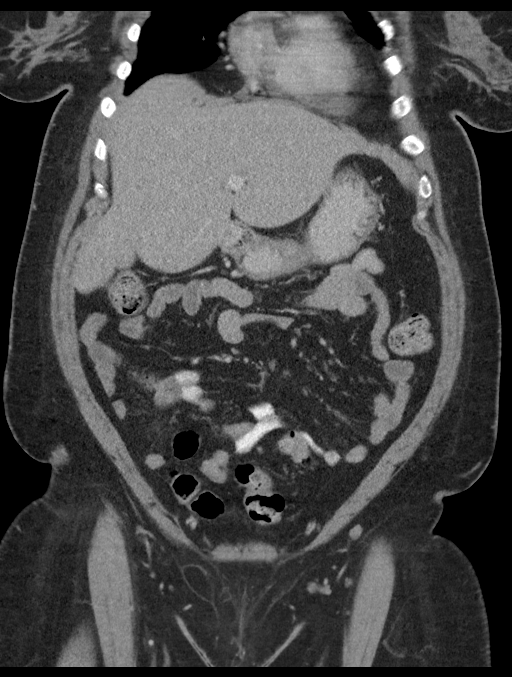
[im 48/107  soft-tissue]
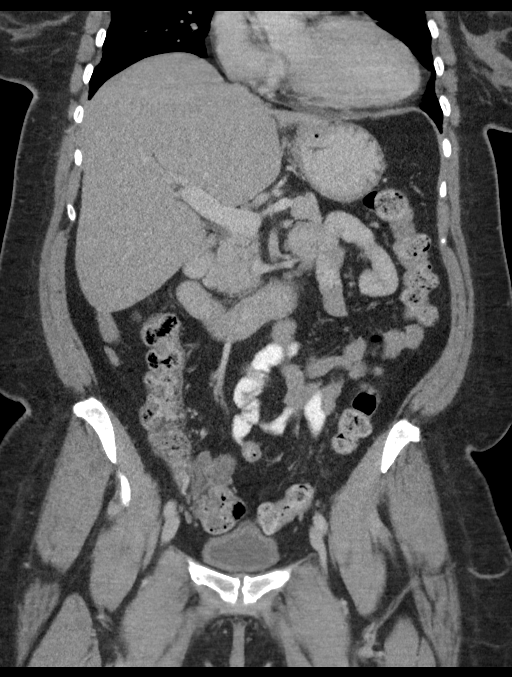
[im 59/107  soft-tissue]
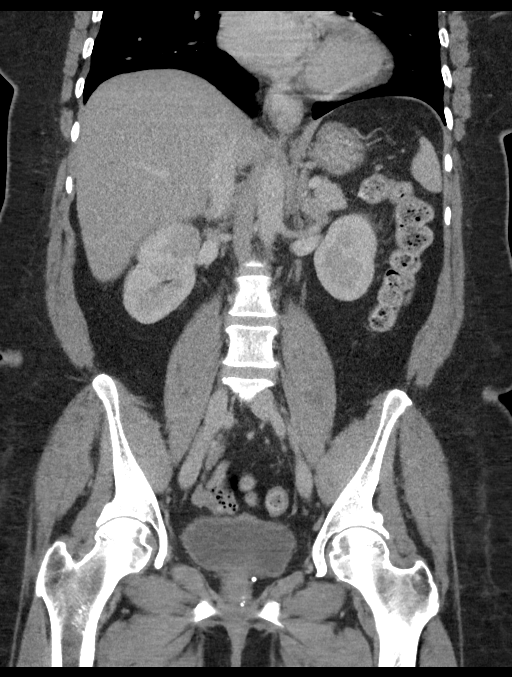

[16 of 46 positions shown; findings below may reference images not displayed]

FINDINGS: Lower chest: No acute abnormality.

Hepatobiliary: No focal liver abnormality is seen. Status post
cholecystectomy. No biliary dilatation.

Pancreas: Unremarkable. No pancreatic ductal dilatation or
surrounding inflammatory changes.

Spleen: Normal in size without focal abnormality.

Adrenals/Urinary Tract: Adrenal glands appear normal. Wedge-shaped
low density is seen in midpole of left kidney as well as in anterior
portion of right kidney. This is concerning for bilateral lobar
nephronia or pyelonephritis. No hydronephrosis or renal obstruction
is noted. Urinary bladder is unremarkable.

Stomach/Bowel: Stomach is within normal limits. Appendix appears
normal. No evidence of bowel wall thickening, distention, or
inflammatory changes.

Vascular/Lymphatic: No significant vascular findings are present. No
enlarged abdominal or pelvic lymph nodes.

Reproductive: Status post hysterectomy. No adnexal masses.

Other: No abdominal wall hernia or abnormality. No abdominopelvic
ascites.

Musculoskeletal: No acute or significant osseous findings.
IMPRESSION: Wedge-shaped low density is noted in midpole of left kidney as well
as involving anterior portion of right kidney. This is concerning
for possible bilateral lobar nephronia or pyelonephritis. Clinical
correlation is recommended.

## 2020-01-29 IMAGING — CT CT ABD-PELV W/ CM
2 of 5 series · 17 of 46 positions shown, 19 images · IV contrast (iopamidol)
Comparison: 09/11/2017

CLINICAL DATA: Nausea and vomiting beginning 03/30/2018. Upper
abdominal pain.

EXAM:
CT ABDOMEN AND PELVIS WITH CONTRAST
TECHNIQUE: Multidetector CT imaging of the abdomen and pelvis was performed
using the standard protocol following bolus administration of
intravenous contrast.
CONTRAST:  100mL [id number] IOPAMIDOL ([id number]) INJECTION 61%

[Series 2: axial st · axial · 0.98mm/px · z∈[-400,-25]mm · 14 of 87 slices shown, 16 images]
[im 6/87  soft-tissue]
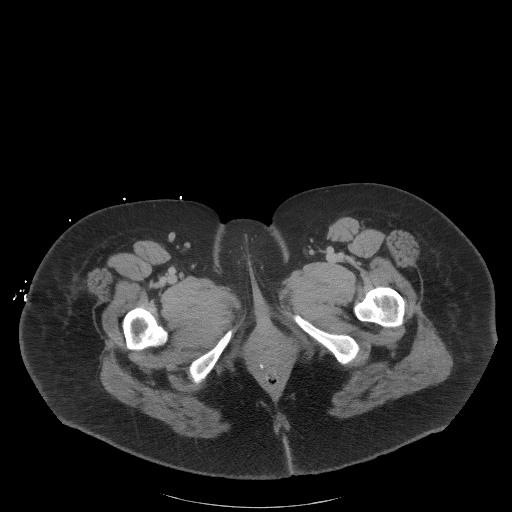
[im 6/87  bone]
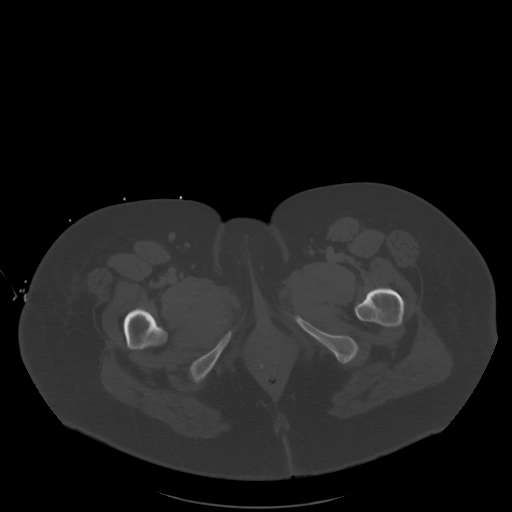
[im 12/87  soft-tissue]
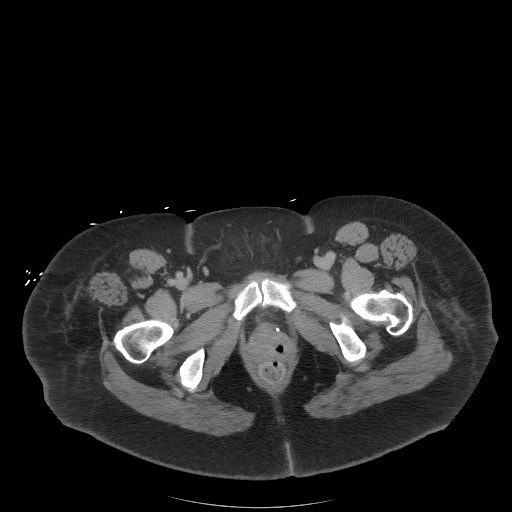
[im 18/87  soft-tissue]
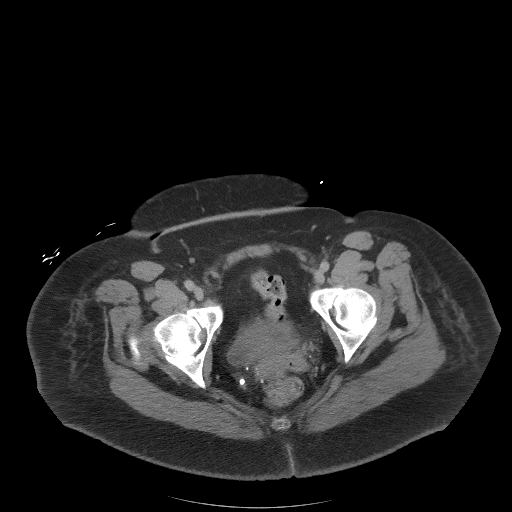
[im 23/87  soft-tissue]
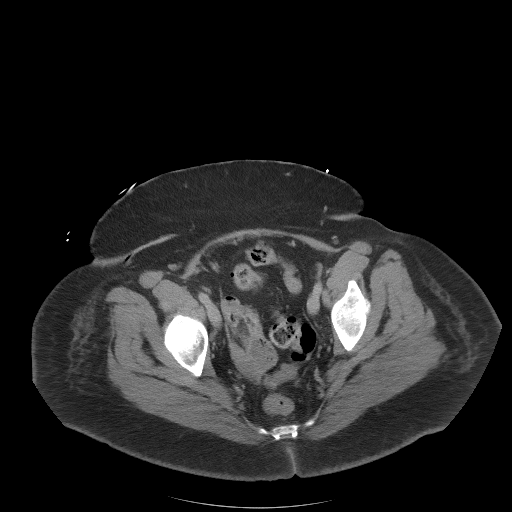
[im 29/87  soft-tissue]
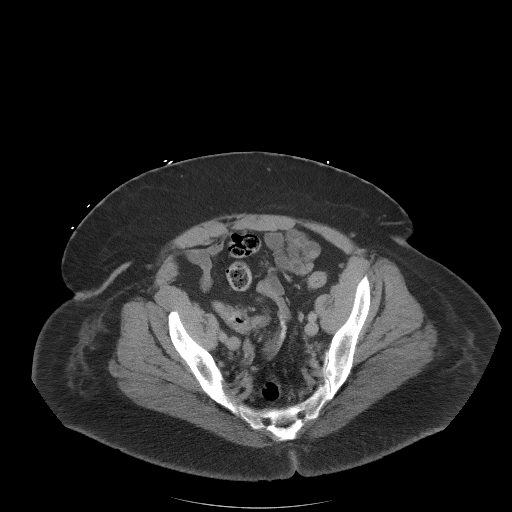
[im 35/87  soft-tissue]
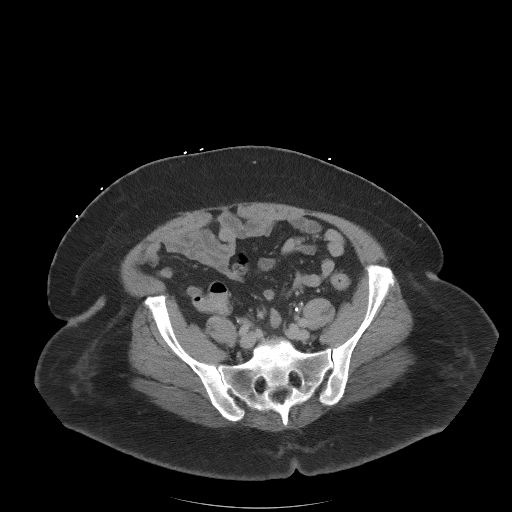
[im 41/87  soft-tissue]
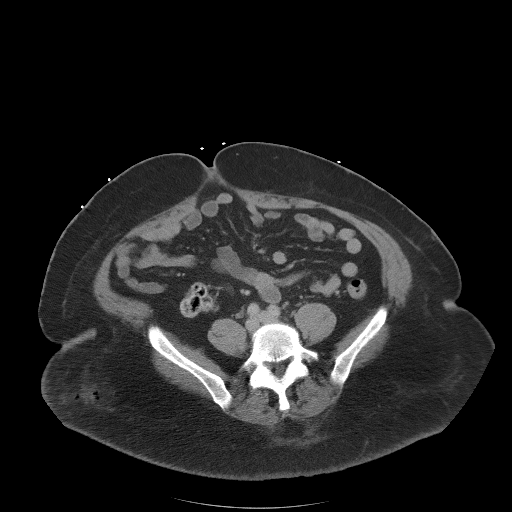
[im 46/87  soft-tissue]
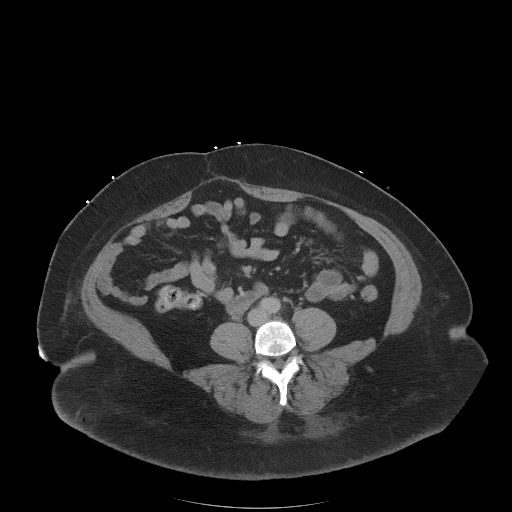
[im 52/87  soft-tissue]
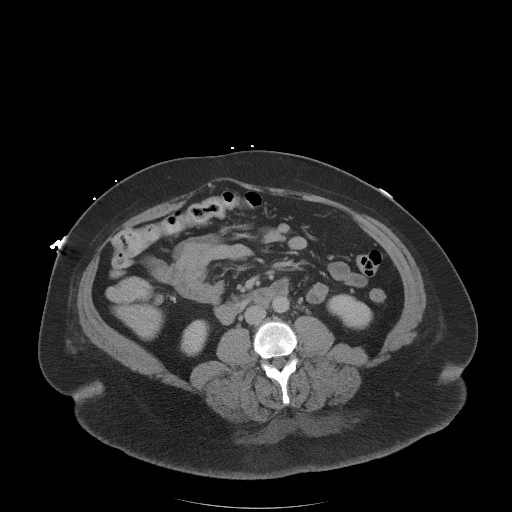
[im 52/87  bone]
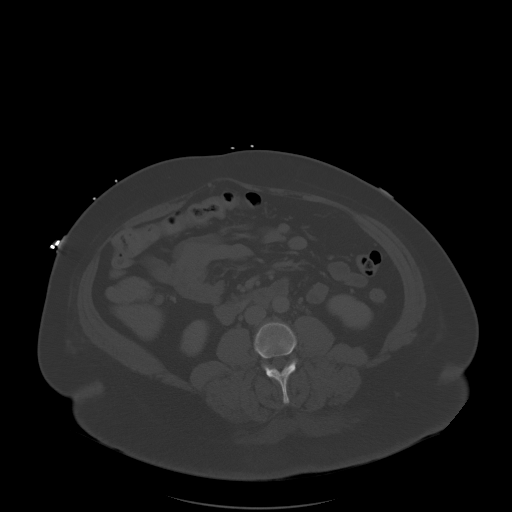
[im 58/87  soft-tissue]
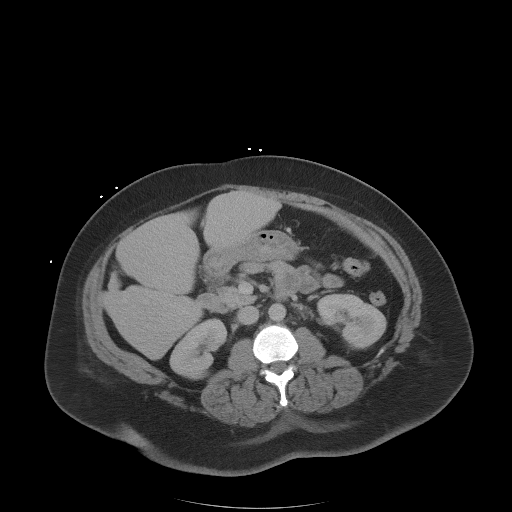
[im 64/87  soft-tissue]
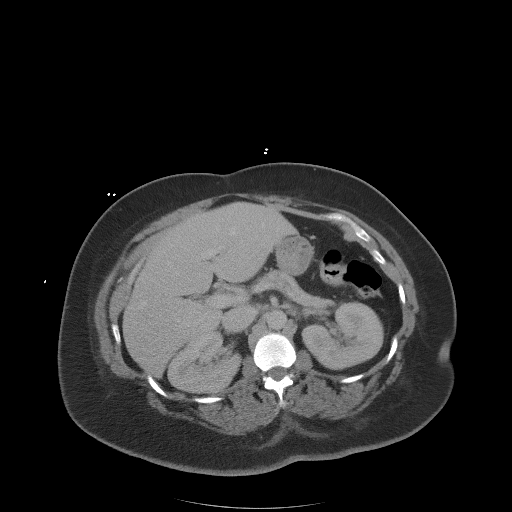
[im 69/87  soft-tissue]
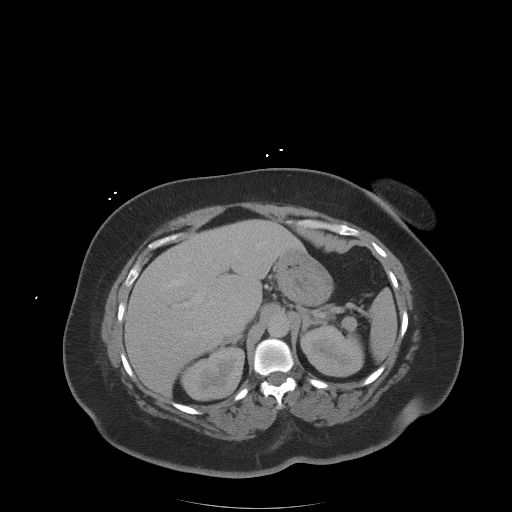
[im 75/87  soft-tissue]
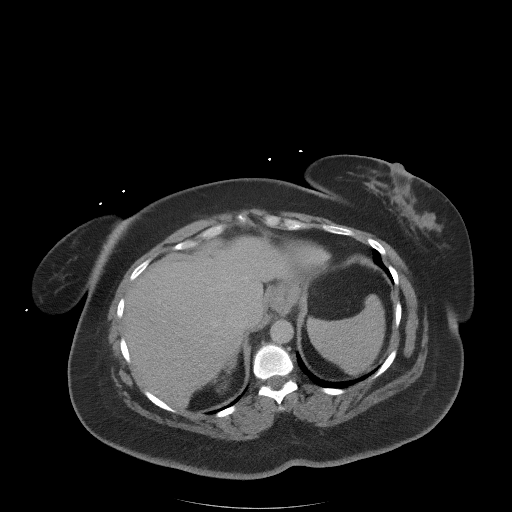
[im 81/87  soft-tissue]
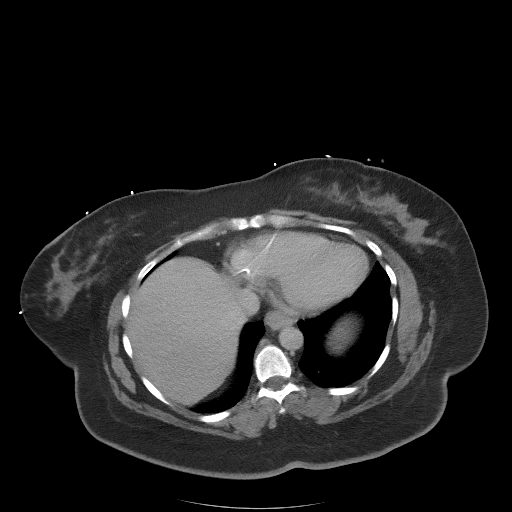

[Series 5: coronal st · coronal · 0.75mm/px · 3 of 117 slices shown]
[im 39/117  soft-tissue]
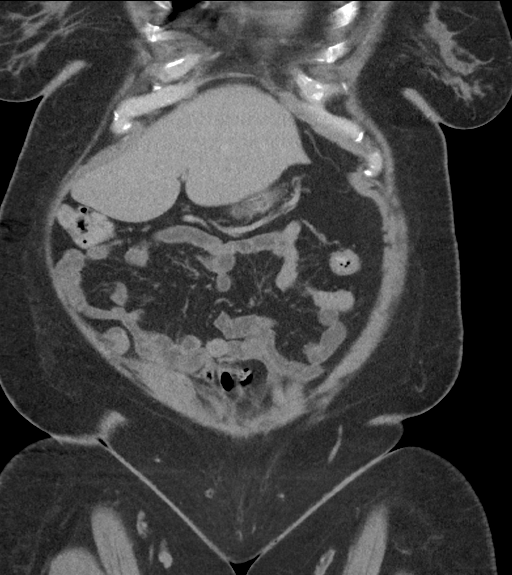
[im 52/117  soft-tissue]
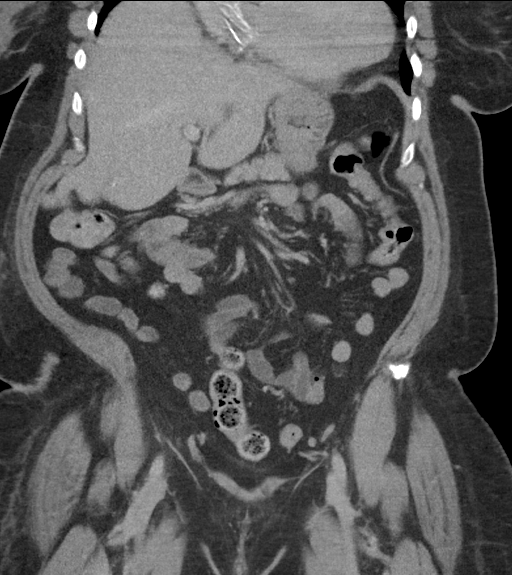
[im 65/117  soft-tissue]
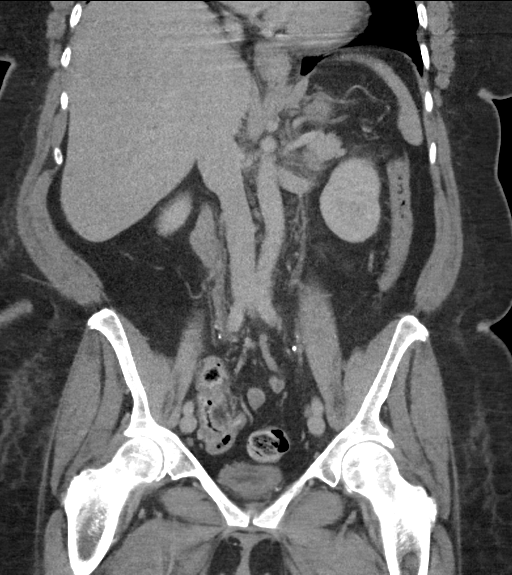

[17 of 46 positions shown; findings below may reference images not displayed]

FINDINGS: Lower chest: Lung bases are unremarkable. There is mild
cardiomegaly.

Hepatobiliary: Previous cholecystectomy. Liver and biliary tree are
normal.

Pancreas: Normal.

Spleen: Normal.

Adrenals/Urinary Tract: Adrenal glands are normal. Kidneys normal in
size without hydronephrosis or nephrolithiasis. Ureters are within
normal. Bladder is somewhat decompressed with mild wall thickening
likely due to decompressed state.

Stomach/Bowel: Stomach and small bowel are normal. Appendix is
normal. Colon is within normal.

Vascular/Lymphatic: Normal.

Reproductive: Previous hysterectomy.

Other: No free fluid or focal inflammatory change.

Musculoskeletal: Mild degenerative change of the hips.
IMPRESSION: No acute findings in the abdomen/pelvis.

Mild wall thickening of the bladder likely due to decompression
although can be seen with cystitis.
